# Patient Record
Sex: Male | Born: 1950 | Race: White | Hispanic: No | Marital: Single | Smoking: Current every day smoker
Health system: Southern US, Community
[De-identification: ages and names within clinical notes are randomized; demographics above are authoritative.]

## PROBLEM LIST (undated history)

## (undated) DIAGNOSIS — M069 Rheumatoid arthritis, unspecified: Secondary | ICD-10-CM

## (undated) DIAGNOSIS — I10 Essential (primary) hypertension: Secondary | ICD-10-CM

## (undated) DIAGNOSIS — E785 Hyperlipidemia, unspecified: Secondary | ICD-10-CM

## (undated) DIAGNOSIS — M109 Gout, unspecified: Secondary | ICD-10-CM

## (undated) DIAGNOSIS — D72829 Elevated white blood cell count, unspecified: Secondary | ICD-10-CM

## (undated) DIAGNOSIS — I2510 Atherosclerotic heart disease of native coronary artery without angina pectoris: Secondary | ICD-10-CM

## (undated) DIAGNOSIS — Z720 Tobacco use: Secondary | ICD-10-CM

## (undated) HISTORY — PX: KNEE SURGERY: SHX244

## (undated) HISTORY — PX: ANGIOPLASTY: SHX39

## (undated) HISTORY — DX: Elevated white blood cell count, unspecified: D72.829

## (undated) HISTORY — PX: APPENDECTOMY: SHX54

## (undated) HISTORY — PX: HEMORRHOID SURGERY: SHX153

## (undated) HISTORY — DX: Gout, unspecified: M10.9

---

## 2006-08-18 ENCOUNTER — Ambulatory Visit: Payer: Self-pay | Admitting: Cardiovascular Disease

## 2006-08-18 LAB — CONVERTED CEMR LAB
CO2: 31 meq/L (ref 19–32)
Chloride: 97 meq/L (ref 96–112)
GFR calc non Af Amer: 107 mL/min
Glucose, Bld: 98 mg/dL (ref 70–99)
HCT: 46.3 % (ref 39.0–52.0)
Hemoglobin: 15.7 g/dL (ref 13.0–17.0)
MCV: 90.5 fL (ref 78.0–100.0)
Platelets: 387 10*3/uL (ref 150–400)
Prothrombin Time: 11.8 s (ref 10.0–14.0)
RBC: 5.12 M/uL (ref 4.22–5.81)
Sodium: 138 meq/L (ref 135–145)

## 2006-08-21 ENCOUNTER — Ambulatory Visit: Payer: Self-pay | Admitting: Cardiovascular Disease

## 2006-08-21 ENCOUNTER — Inpatient Hospital Stay (HOSPITAL_BASED_OUTPATIENT_CLINIC_OR_DEPARTMENT_OTHER): Admission: RE | Admit: 2006-08-21 | Discharge: 2006-08-21 | Payer: Self-pay | Admitting: Cardiovascular Disease

## 2006-09-04 ENCOUNTER — Ambulatory Visit: Payer: Self-pay | Admitting: Cardiovascular Disease

## 2009-01-31 ENCOUNTER — Encounter: Admission: RE | Admit: 2009-01-31 | Discharge: 2009-01-31 | Payer: Self-pay | Admitting: Family Medicine

## 2010-12-07 NOTE — Cardiovascular Report (Signed)
[name], [name] NO.:  [id number]   MEDICAL RECORD NO.:  [id number]          PATIENT TYPE:  OIB   LOCATION:  1961                         FACILITY:  [facility]   PHYSICIAN:  Veverly Fells. Excell Seltzer, MD  DATE OF BIRTH:  01-Mar-1951   DATE OF PROCEDURE:  DATE OF DISCHARGE:  08/21/2006                            CARDIAC CATHETERIZATION   PROCEDURES:  1. Left heart catheterization.  2. Selective coronary angiography.  3. Left ventricular angiography.   INDICATION:  [name] is a very nice 60 year old gentleman who I  evaluated earlier this week in the office for complaints of substernal  chest pain.  He has had increasing symptoms over the last two months.  He had a previous cath that demonstrated nonobstructive coronary artery  disease greater than ten years ago.  The patient has dyslipidemia,  hypertension, tobacco abuse and a very strong family history coronary  artery disease.  With these multiple risk factors, I elected to proceed  with cardiac catheterization.   PROCEDURAL DETAILS:  The risks and indications of the procedure were  explained in detail to the patient.  Informed consent was obtained.  The  right groin was prepped, draped and anesthetized with 1% lidocaine. A 4-  French sheath was inserted into the right femoral artery using the  modified Seldinger technique.  Multiple views of the left and right  coronary arteries were taken with standard 4-French catheters.  A 4-  French pigtail catheter was inserted into the left ventricle and  ventriculography was performed.  A pullback across the aortic valve was  done.  All catheter exchanges were performed over a guidewire.  There  were no complications.   FINDINGS:  Aortic pressure 124/74 with a mean of 96, left  ventricular  pressure 121/14 with an end-diastolic pressure of __________ .   Coronary angiography:  The left mainstem is angiographically normal.  It  bifurcates into the LAD and left  circumflex.  The LAD is a large caliber  vessel that courses down to the left ventricular apex.  It gives off a  very small first and second diagonal branch and a medium-caliber third  diagonal branch.  The LAD is normal in its proximal segment.  In the mid-  segment just beyond the first septal perforator, there is an area of 30-  to-50% stenosis.  The remainder of the mid and distal LAD are free of  any significant angiographic disease.   The left circumflex system is dominant.  It gives off a large first  obtuse marginal branch.  Just beyond the marginal branch origin, there  is a 30% stenosis in the mid-circumflex.  It then courses down to give  off a  left PDA and left posterolateral branch.  The left PDA has an  area of 30-to-40% stenosis.   The right coronary artery is small and nondominant.  There is no  evidence of any angiographic disease.   Left ventricular function was assessed by 30 degree right anterior  oblique left ventriculogram __________  left ventricular function with  an overall left ventricular ejection fraction of 55%.  There is  no  mitral regurgitation.   ASSESSMENT:  1. Nonobstructive coronary artery disease.  2. Normal left ventricular function.   RECOMMENDATIONS:  I recommend continued medical  therapy.  I suspect  that [name]'s pain is noncardiac.  I think his LAD lesion is at  worst, 50%.  However, if he has persistent symptoms, we could consider a  stress Myoview, but I do not suspect that it is causing his chest pain  at this point.  He should continue with aggressive medical therapy, as  he does have coronary artery disease albeit nonobstructive.      Veverly Fells. Excell Seltzer, MD  Electronically Signed     MDC/MEDQ  D:  08/21/2006  T:  08/21/2006  Job:  [id number]   cc:   Silas Sacramento, M.D.

## 2010-12-07 NOTE — Assessment & Plan Note (Signed)
[facility]                            CARDIOLOGY OFFICE NOTE   NAME:[name], [name]                     MRN:          [id number]  DATE:09/04/2006                            DOB:          July 24, 1950    Angelena Sole was seen in followup after a diagnostic catheterization  performed at [facility] on January 31 of this year.  He initially  presented with substernal chest tightness and generalized fatigue over  the prior 2 months.  He had multiple cardiac risk factors including  hypertension, dyslipidemia, and tobacco abuse, and we elected to perform  a diagnostic catheterization based on his multiple risk factors and high  pre-test probability of significant CAD.   Mr. Mavis catheterization demonstrated nonobstructive coronary  artery disease.  He had a mild stenosis in the mid LAD in the range of  30-50%.  He also had nonobstructive disease of the mid left circumflex  with a 30% stenosis and a small nondominant right coronary artery with  no angiographic disease.  His left ventricular function was normal as  assessed by left ventriculography with an EF of 55%.   Since his procedure, Mr. Woessner has continued to have intermittent  chest pain.  This has not been consistently related to activity.  His  pain is not as bad as it had been in the past.   CURRENT MEDICATIONS:  1. Hydrochlorothiazide 25 mg daily.  2. Extended release metoprolol 100 mg daily.  3. Crestor 10 mg daily.  4. Aspirin 81 mg daily.  5. Centrum multivitamin daily.   ALLERGIES:  NKDA.   EXAMINATION:  The patient is alert and oriented.  He is in no acute  distress.  Weight 203 pounds, blood pressure 129/80, heart rate is 80, respiratory  rate is 12.  HEENT:  Normal.  NECK:  Normal carotid upstrokes without bruits.  Jugular venous pressure  is normal.  LUNGS:  Clear to auscultation bilaterally.  HEART:  Regular rate and rhythm without murmurs or gallops.  ABDOMEN:  Soft  and nontender.  No organomegaly.  EXTREMITIES:  The right groin site is clear.  There is no hematoma,  ecchymosis, or bruit.  Peripheral pulses are 2+ and equal.   ASSESSMENT:  Mr. Shall is currently stable from a cardiac standpoint.  His problems are as follows.  1. Nonobstructive coronary artery disease.  2. Hypertension.  3. Dyslipidemia.  4. Tobacco abuse.   I had a long discussion with Mr. Gappa regarding the findings from his  catheterization.  I think he should do very well in the long term.  The  most important thing he can do from a long-term risk reduction  standpoint is to stop smoking.  Regarding his intermittent pain, it may  be reasonable to give him an empiric trial of a proton pump inhibitor.  He also tells me that he may require a pulmonary evaluation as he has  had some abnormal pulmonary testing.  I will leave the treatment plan to  Dr. Neva Seat as Mr. Neal preferred to review his further diagnostic and  treatment options with Dr. Neva Seat.  I will see Mr. Masci back on an as needed basis if any problems arise.     Veverly Fells. Excell Seltzer, MD  Electronically Signed    MDC/MedQ  DD: 09/04/2006  DT: 09/04/2006  Job #: [id number]   cc:   Silas Sacramento, M.D.

## 2010-12-07 NOTE — Letter (Signed)
August 18, 2006    Silas Sacramento, MD  [facility]  [address]  [place], [place] [address]   RE:  [name], [name]  MRN:  [id number]  /  DOB:  07-Aug-1950   Dear Dr. Neva Seat:   It was my pleasure to see Wendelin Bradt at the [facility]  [facility] on August 18, 2006 as an outpatient.  As you know, Mr. Teixeira  is a very nice 60 year old man presenting for evaluation of chest pain.  He describes a history of substernal chest tightness and an associated  feeling of tiredness that has been increasing over the last 2 months.  His symptoms are not consistently related to exertion, but they do seem  to be worse on days that he is performing physical activities.  He  denies any associated dyspnea, light-headedness, syncope, nausea,  vomiting, or diaphoresis.  He also denies orthopnea, PND, palpitations,  edema, or leg claudication symptoms.   Mr. Entwistle underwent cardiac catheterization back in 1996 at [facility].  He also describes a history of a stress test around that same  time.  We do not have records for that, but will send for them.  He has  not had regular cardiac followup since that time.   PAST MEDICAL HISTORY:  Pertinent for the following.  1. Chest pain with subsequent cardiac catheterization.  Uncertain      results.  As above.  Will send for details.  2. Hypertension.  3. Dyslipidemia.  4. Tobacco abuse.  5. Remote appendectomy.  6. Hemorrhoidectomy in 1977.   The patient denies any other surgeries, hospitalizations, or medical  problems.   SOCIAL HISTORY:  The patient works in [employer].  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  He has been a smoker for over 40 years.  He used to smoke 2 packs per  day and has recently cut down to less than 1 pack per day.  He has no  history of drug use or alcohol use.  He is not married and has no  children.   FAMILY HISTORY:  His mother died at age 19  suddenly.  His father died at  age 23 of a myocardial infarction.  He has 3 sisters who have high blood  pressure and high cholesterol.  He has 1 brother who died after coronary  bypass of complications from a stroke at age 10.  He has a 32 year old  brother who recently had an ICD placed, and he has a 74 year old brother  who has had 5 bypasses and has also had an ICD placed.   REVIEW OF SYSTEMS:  A complete 12-point review of systems was performed.  There were no pertinent positives to report other than as described.   PHYSICAL EXAMINATION:  The patient is alert and oriented.  He is in no  acute distress.  Weight is 201 pounds, blood pressure 140/88, heart rate 75, respiratory  rate is 12.  HEENT:  Normal.  NECK:  Normal carotid upstrokes without bruits.  Jugular venous pressure  is normal.  No lymphadenopathy.  No lymphadenopathy or thyroid nodules.  LUNGS:  Clear to auscultation bilaterally.  CARDIOVASCULAR:  The apex is discrete and nondisplaced.  The heart is  regular rate and rhythm without murmurs or gallops.  ABDOMEN:  Soft, obese, and nontender.  No organomegaly.  No abdominal  bruits.  BACK:  No flank tenderness or paraspinal tenderness.  EXTREMITIES:  No cyanosis, clubbing, or  edema.  Peripheral pulses are 2+  and equal throughout.  SKIN:  Warm and dry without rash.  NEUROLOGIC:  Strength is 5/5 and equal in the arms and legs.  Cranial  nerves 2-12 are intact.   [facility] showed a  normal sinus rhythm and was within normal limits.   ASSESSMENT:  Mr. Goren is a 60 year old man with a very strong family  history of coronary artery disease as well as cardiac risk factors of  hypertension, dyslipidemia, and tobacco abuse, who presents with  substernal chest pressure.  He has both typical and atypical symptoms,  but he clearly has worsened symptoms with exertion.  He has 2 months now  of significant symptoms.  I have  reviewed the diagnostic options, which  reviewed both stress testing, as well as a diagnostic cardiac  catheterization.  I think the pretest probability of significant  coronary artery disease is high and I have recommended undergoing a  diagnostic catheterization.  The patient is agreeable.  The risks and  indications of a diagnostic cath were reviewed in detail, and the  patient understood fully.  In the meantime, he was advised to continue  on his current medical therapy as well as with his extended release  metoprolol, as well as daily aspirin and Statin therapy.  He was given a  prescription for sublingual nitroglycerin and instructed on its use.  He  was advised to contact EMS if he has any increase in symptoms or  significant resting symptoms.   We have scheduled him for a diagnostic cardiac catheterization to be  done in the [facility] later this week.  We will make further plans pending  the results of this study.   Regarding his hypertension and dyslipidemia, we will send for results of  his recent lipid panel.   Dr. Neva Seat, thanks again for allowing me to evaluate Mr. Trachtenberg.  I  will be in contact with you after Mr. Bursch's cardiac catheterization.  Please feel free to call at any time with questions regarding his care.    Sincerely,      Veverly Fells. Excell Seltzer, MD  Electronically Signed    MDC/MedQ  DD: 08/18/2006  DT: 08/18/2006  Job #: [id number]

## 2011-07-12 ENCOUNTER — Emergency Department (HOSPITAL_COMMUNITY): Payer: BC Managed Care – PPO

## 2011-07-12 ENCOUNTER — Other Ambulatory Visit: Payer: Self-pay

## 2011-07-12 ENCOUNTER — Emergency Department (HOSPITAL_COMMUNITY)
Admission: EM | Admit: 2011-07-12 | Discharge: 2011-07-12 | Disposition: A | Discharge status: Home / Self Care | Payer: BC Managed Care – PPO | Attending: Emergency Medicine | Admitting: Emergency Medicine

## 2011-07-12 ENCOUNTER — Ambulatory Visit (INDEPENDENT_AMBULATORY_CARE_PROVIDER_SITE_OTHER): Payer: BC Managed Care – PPO

## 2011-07-12 DIAGNOSIS — R51 Headache: Secondary | ICD-10-CM | POA: Insufficient documentation

## 2011-07-12 DIAGNOSIS — R000 Tachycardia, unspecified: Secondary | ICD-10-CM | POA: Insufficient documentation

## 2011-07-12 DIAGNOSIS — R55 Syncope and collapse: Secondary | ICD-10-CM | POA: Insufficient documentation

## 2011-07-12 DIAGNOSIS — R079 Chest pain, unspecified: Secondary | ICD-10-CM | POA: Insufficient documentation

## 2011-07-12 DIAGNOSIS — J111 Influenza due to unidentified influenza virus with other respiratory manifestations: Secondary | ICD-10-CM | POA: Insufficient documentation

## 2011-07-12 DIAGNOSIS — J189 Pneumonia, unspecified organism: Secondary | ICD-10-CM

## 2011-07-12 DIAGNOSIS — R1110 Vomiting, unspecified: Secondary | ICD-10-CM

## 2011-07-12 DIAGNOSIS — M79609 Pain in unspecified limb: Secondary | ICD-10-CM

## 2011-07-12 HISTORY — DX: Essential (primary) hypertension: I10

## 2011-07-12 HISTORY — DX: Hyperlipidemia, unspecified: E78.5

## 2011-07-12 HISTORY — DX: Atherosclerotic heart disease of native coronary artery without angina pectoris: I25.10

## 2011-07-12 LAB — BASIC METABOLIC PANEL
BUN: 13 mg/dL (ref 6–23)
CO2: 26 mEq/L (ref 19–32)
Calcium: 8.9 mg/dL (ref 8.4–10.5)
Creatinine, Ser: 0.82 mg/dL (ref 0.50–1.35)
GFR calc non Af Amer: >90 mL/min (ref >90)
Glucose, Bld: 103 mg/dL — ABNORMAL HIGH (ref 70–99)
Sodium: 132 mEq/L — ABNORMAL LOW (ref 135–145)

## 2011-07-12 LAB — CBC
HCT: 44 % (ref 39.0–52.0)
Hemoglobin: 14.9 g/dL (ref 13.0–17.0)
MCH: 30.3 pg (ref 26.0–34.0)
MCHC: 33.9 g/dL (ref 30.0–36.0)
MCV: 89.6 fL (ref 78.0–100.0)
RBC: 4.91 MIL/uL (ref 4.22–5.81)

## 2011-07-12 MED ORDER — OSELTAMIVIR PHOSPHATE 75 MG PO CAPS: 75.0000 mg | ORAL_CAPSULE | Freq: Two times a day (BID) | ORAL | Status: AC

## 2011-07-12 MED ORDER — SODIUM CHLORIDE 0.9 % IV BOLUS (SEPSIS)
1000.0000 mL | Freq: Once | INTRAVENOUS | Status: AC
  Administered 2011-07-12: 1000 mL via INTRAVENOUS

## 2011-07-12 MED ORDER — NITROGLYCERIN 0.4 MG SL SUBL
0.4000 mg | SUBLINGUAL_TABLET | SUBLINGUAL | Status: AC | PRN
  Administered 2011-07-12 (×3): 0.4 mg via SUBLINGUAL
  Filled 2011-07-12: qty 25

## 2011-07-12 NOTE — ED Notes (Signed)
Pt came from Urgent Care in [place], feverish and with 24 hours of muscle aches and malaise

## 2011-07-12 NOTE — ED Notes (Signed)
Pt reports relief of chest pressure after three dose of nitro.

## 2011-07-12 NOTE — ED Notes (Signed)
Lab informed of troponin and lab will draw this troponin

## 2011-07-12 NOTE — ED Provider Notes (Signed)
Medical screening examination/treatment/procedure(s) were conducted as a shared visit with non-physician practitioner(s) and myself.  I personally evaluated the patient during the encounter  Flu like symptoms with chest tightness intermittently x 2 days.  Syncopal episode at [facility] with nausea and vomiting.  Hx PCI without stents.  Glynn Octave, MD 07/12/11 [phone]

## 2011-07-12 NOTE — ED Notes (Signed)
Nitro given. Pt now resting. Will Check back in 5 min

## 2011-07-12 NOTE — ED Notes (Signed)
EDP in room re-evaluating patient

## 2011-07-12 NOTE — Consult Note (Signed)
HPI: 60 year old male with past medical history of nonobstructive coronary disease by previous catheterization, hypertension, hyperlipidemia with chest pain. Patient did have a cardiac catheterization in January of 2008. This revealed a 30-50% mid LAD, a 30% mid left circumflex lesion and a small nondominant right coronary artery with no disease. His ejection fraction was 55%. The patient typically does not have dyspnea on exertion, orthopnea, PND, pedal edema, exertional chest pain or syncope. Yesterday he developed general malaise, diffuse body aches, fevers, chills, nausea and vomiting. He then developed substernal chest tightness. The pain was not pleuritic, positional or related to food. It did not radiate. No associated nausea, shortness of breath or diaphoresis. It began at 7:30 last evening and has been essentially continuous since then. He was seen at urgent care and while in the waiting room he developed mild diaphoresis, nausea followed by frank syncope. No associated palpitations or chest pain. Cardiology is now asked to evaluate.  Medications Prior to Admission  Medication Dose Route Frequency Provider Last Rate Last Dose  . nitroGLYCERIN (NITROSTAT) SL tablet 0.4 mg  0.4 mg Sublingual Q5 min PRN Shaaron Adler, PA   0.4 mg at 07/12/11 1500  . sodium chloride 0.9 % bolus 1,000 mL  1,000 mL Intravenous Once Shaaron Adler, PA   1,000 mL at 07/12/11 1448   No current outpatient prescriptions on file as of 07/12/2011.    No Known Allergies  Past Medical History  Diagnosis Date  . CAD (coronary artery disease)   . Hypertension   . Hyperlipidemia     Past Surgical History  Procedure Date  . Appendectomy   . Hemorrhoid surgery     History   Social History  . Marital Status: Single    Spouse Name: N/A    Number of Children: N/A  . Years of Education: N/A   Occupational History  . Not on file.   Social History Main Topics  . Smoking status: Current  Everyday Smoker  . Smokeless tobacco: Not on file  . Alcohol Use: No  . Drug Use: Not on file  . Sexually Active: Not on file   Other Topics Concern  . Not on file   Social History Narrative  . No narrative on file    Family History  Problem Relation Age of Onset  . Coronary artery disease      Father with MI in his 9s    ROS: malaise, fevers and chills but no productive cough, hemoptysis, dysphasia, odynophagia, melena, hematochezia, dysuria, hematuria, rash, seizure activity, orthopnea, PND, pedal edema, claudication. Remaining systems are negative.  Physical Exam:   Blood pressure 129/84, pulse 105, temperature 99.1 F (37.3 C), temperature source Oral, resp. rate 19, SpO2 97.00%.  General:  Well developed/well nourished in NAD Skin warm/dry Patient not depressed No peripheral clubbing Back-normal HEENT-normal/normal eyelids Neck supple/normal carotid upstroke bilaterally; no bruits; no JVD; no thyromegaly chest - CTA/ normal expansion CV - RRR/normal S1 and S2; no murmurs, rubs or gallops;  PMI nondisplaced Abdomen -NT/ND, no HSM, no mass, + bowel sounds, no bruit 2+ femoral pulses, no bruits Ext-no edema, chords, 2+ DP Neuro-grossly nonfocal  ECG NSR with no ST changes.  Results for orders placed during the hospital encounter of 07/12/11 (from the past 48 hour(s))  CBC     Status: Abnormal   Collection Time   07/12/11 12:00 PM      Component Value Range Comment   WBC 11.7 (*) 4.0 - 10.5 (K/uL)  RBC 4.91  4.22 - 5.81 (MIL/uL)    Hemoglobin 14.9  13.0 - 17.0 (g/dL)    HCT 95.6  21.3 - 08.6 (%)    MCV 89.6  78.0 - 100.0 (fL)    MCH 30.3  26.0 - 34.0 (pg)    MCHC 33.9  30.0 - 36.0 (g/dL)    RDW 57.8  46.9 - 62.9 (%)    Platelets 312  150 - 400 (K/uL)   BASIC METABOLIC PANEL     Status: Abnormal   Collection Time   07/12/11 12:00 PM      Component Value Range Comment   Sodium 132 (*) 135 - 145 (mEq/L)    Potassium 4.1  3.5 - 5.1 (mEq/L)    Chloride 98   96 - 112 (mEq/L)    CO2 26  19 - 32 (mEq/L)    Glucose, Bld 103 (*) 70 - 99 (mg/dL)    BUN 13  6 - 23 (mg/dL)    Creatinine, Ser 5.28  0.50 - 1.35 (mg/dL)    Calcium 8.9  8.4 - 10.5 (mg/dL)    GFR calc non Af Amer >90  >90 (mL/min)    GFR calc Af Amer >90  >90 (mL/min)   POCT I-STAT TROPONIN I     Status: Normal   Collection Time   07/12/11  1:16 PM      Component Value Range Comment   Troponin i, poc 0.01  0.00 - 0.08 (ng/mL)    Comment 3              Dg Chest 2 View  07/12/2011  *RADIOLOGY REPORT*  Clinical Data: Cough.  Chest tightness.  Smoking history.  CHEST - 2 VIEW  Comparison: None.  Findings: Artifact overlies chest.  Heart size is normal. Mediastinal shadows are normal.  Lung markings are slightly prominent.  I think there is mild bronchial thickening.  No infiltrate, collapse or effusion.  No significant bony finding.  IMPRESSION: Increased markings consistent with smoking history.  Some bronchial thickening suggests bronchitis.  No pneumonia or collapse.  Original Report Authenticated By: Thomasenia Sales, M.D.    Assessment/Plan Patient Active Hospital Problem List:  #1-chest pain-the patient's symptoms are atypical. He clearly has flulike symptoms. His chest pain has been essentially continuous for 20 hours. His electrocardiogram is normal. His initial enzymes are negative. Would repeat enzymes and if negative he has essentially ruled out. Note previous cardiac catheterization showed nonobstructive coronary disease. If enzymes negative he can be discharged from a cardiac standpoint with outpatient functional study. #2 syncope-the patient had a syncopal episode in the setting of diaphoresis and mild nausea. He is also having flulike symptoms with decreased by mouth intake. I think this is most likely a vagal reaction. #3 history of nonobstructive coronary disease-would treat with aspirin, Toprol 25 mg daily and a statin. #4 hypertension-would add Toprol 25 mg daily.  #5-flulike  symptoms-management per emergency room  Patient can followup with Dr. Excell Seltzer following discharge and his functional study. Olga Millers MD 07/12/2011, 4:08 PM

## 2011-07-12 NOTE — ED Notes (Addendum)
Pt reports feeling little better with first dose of nitroglycerin SL. Second dose of Nitro given

## 2011-07-12 NOTE — ED Provider Notes (Signed)
History     CSN: [id number]  Arrival date & time 07/12/11  1042   First MD Initiated Contact with Patient 07/12/11 1113      Chief Complaint  Patient presents with  . Influenza    (Consider location/radiation/quality/duration/timing/severity/associated sxs/prior treatment) The history is provided by the patient and a relative.   the patient is transferred to the emergency department via EMS. He is a 60 year old male with a history significant for coronary artery disease and PCI in both 1996 and 2008 by [facility] cardiology, Dr. Excell Seltzer. He no longer follows up with the cardiologist. The patient reports that yesterday he had an onset of chills, fever, cough, headache, nasal congestion, myalgias. He also reports chest tightness that feels like "an elephant sitting on my chest" with radiation to the left arm associated with nausea and intermittent diaphoresis. When the patient presented to [facility] urgent care this morning for evaluation, he had a witnessed syncopal episode was preceded by a wave of nausea and diaphoresis. The chest tightness is persistent and unchanged, nothing makes it better or worse. There has been no prior treatment.  No past medical history on file.  No past surgical history on file.  No family history on file.  History  Substance Use Topics  . Smoking status: Not on file  . Smokeless tobacco: Not on file  . Alcohol Use: Not on file      Review of Systems  Constitutional: Positive for fever, chills, diaphoresis and fatigue.  HENT: Positive for congestion and rhinorrhea. Negative for ear pain, sore throat, neck pain, neck stiffness and tinnitus.   Eyes: Negative for pain and visual disturbance.  Respiratory: Positive for cough and chest tightness. Negative for shortness of breath and wheezing.   Cardiovascular: Positive for chest pain. Negative for palpitations and leg swelling.  Gastrointestinal: Positive for nausea and vomiting. Negative for abdominal pain and  constipation.  Genitourinary: Negative for dysuria, hematuria and flank pain.  Musculoskeletal: Negative for back pain, joint swelling and gait problem.  Skin: Negative for rash and wound.  Neurological: Positive for syncope, weakness and headaches. Negative for dizziness, seizures and speech difficulty.  Hematological: Does not bruise/bleed easily.  Psychiatric/Behavioral: Negative for confusion.    Allergies  Review of patient's allergies indicates no known allergies.  Home Medications  No current outpatient prescriptions on file.  BP 129/84  Pulse 105  Temp(Src) 99.1 F (37.3 C) (Oral)  Resp 19  SpO2 97%  Physical Exam  Nursing note and vitals reviewed. Constitutional: He is oriented to person, place, and time. He appears well-developed and well-nourished.       Uncomfortable appearing  HENT:  Head: Normocephalic.  Right Ear: External ear normal.  Left Ear: External ear normal.  Mouth/Throat: Oropharynx is clear and moist. No oropharyngeal exudate.       Bilateral TMs normal. Clear Rhinorrhea.  Eyes: Conjunctivae and EOM are normal. Pupils are equal, round, and reactive to light. Right eye exhibits no discharge. Left eye exhibits no discharge.  Neck: Normal range of motion. Neck supple. No JVD present.  Cardiovascular: Regular rhythm, normal heart sounds and intact distal pulses.   No murmur heard.      Tachycardia  Pulmonary/Chest: Effort normal and breath sounds normal. No respiratory distress. He has no wheezes. He exhibits no tenderness.  Abdominal: Soft. Bowel sounds are normal. He exhibits no distension. There is no tenderness.  Musculoskeletal: Normal range of motion. He exhibits no edema and no tenderness.  Lymphadenopathy:    He has  no cervical adenopathy.  Neurological: He is alert and oriented to person, place, and time. No cranial nerve deficit. Coordination normal.  Skin: Skin is warm and dry. No rash noted. He is not diaphoretic.  Psychiatric: His behavior  is normal.    ED Course  Procedures (including critical care time)  Labs Reviewed  CBC - Abnormal; Notable for the following:    WBC 11.7 (*)    All other components within normal limits  BASIC METABOLIC PANEL - Abnormal; Notable for the following:    Sodium 132 (*)    Glucose, Bld 103 (*)    All other components within normal limits  I-STAT TROPONIN I   Dg Chest 2 View  07/12/2011  *RADIOLOGY REPORT*  Clinical Data: Cough.  Chest tightness.  Smoking history.  CHEST - 2 VIEW  Comparison: None.  Findings: Artifact overlies chest.  Heart size is normal. Mediastinal shadows are normal.  Lung markings are slightly prominent.  I think there is mild bronchial thickening.  No infiltrate, collapse or effusion.  No significant bony finding.  IMPRESSION: Increased markings consistent with smoking history.  Some bronchial thickening suggests bronchitis.  No pneumonia or collapse.  Original Report Authenticated By: Thomasenia Sales, M.D.    Date: 07/12/2011  Rate: 95  Rhythm: sinus  QRS Axis: normal  Intervals: normal  ST/T Wave abnormalities: normal  Conduction Disutrbances:none  Narrative Interpretation:   Old EKG Reviewed: none available    Dx 1: Chest pain Dx 2: Influenza   MDM  Patient with both influenza symptoms and chest pain symptoms concerning for ACS given his history, the EKG was normal and troponin was negative. [facility] was consulted.        Shaaron Adler, [place] 07/12/11 1543  Shaaron Adler, [place] 07/12/11 1543   Pt seen by cardiologist, who does not feel this is secondary to ACS and recommends outpatient cardiac evaluation as needed. Second troponin ordered, pt to be discharged home if negative.  Elwyn Reach [place], [place] 07/12/11 [phone]

## 2011-07-25 ENCOUNTER — Other Ambulatory Visit (HOSPITAL_COMMUNITY): Payer: Self-pay | Admitting: Cardiology

## 2011-07-25 DIAGNOSIS — R079 Chest pain, unspecified: Secondary | ICD-10-CM

## 2011-07-31 ENCOUNTER — Ambulatory Visit (HOSPITAL_COMMUNITY): Payer: BC Managed Care – PPO | Attending: Cardiology | Admitting: Radiology

## 2011-07-31 VITALS — BP 140/82 | Ht 69.0 in | Wt 188.0 lb

## 2011-07-31 DIAGNOSIS — R5383 Other fatigue: Secondary | ICD-10-CM | POA: Insufficient documentation

## 2011-07-31 DIAGNOSIS — R55 Syncope and collapse: Secondary | ICD-10-CM | POA: Insufficient documentation

## 2011-07-31 DIAGNOSIS — Z8249 Family history of ischemic heart disease and other diseases of the circulatory system: Secondary | ICD-10-CM | POA: Insufficient documentation

## 2011-07-31 DIAGNOSIS — E785 Hyperlipidemia, unspecified: Secondary | ICD-10-CM | POA: Insufficient documentation

## 2011-07-31 DIAGNOSIS — R5381 Other malaise: Secondary | ICD-10-CM | POA: Insufficient documentation

## 2011-07-31 DIAGNOSIS — R0789 Other chest pain: Secondary | ICD-10-CM | POA: Insufficient documentation

## 2011-07-31 DIAGNOSIS — R110 Nausea: Secondary | ICD-10-CM | POA: Insufficient documentation

## 2011-07-31 DIAGNOSIS — R61 Generalized hyperhidrosis: Secondary | ICD-10-CM | POA: Insufficient documentation

## 2011-07-31 DIAGNOSIS — Z87891 Personal history of nicotine dependence: Secondary | ICD-10-CM | POA: Insufficient documentation

## 2011-07-31 DIAGNOSIS — R079 Chest pain, unspecified: Secondary | ICD-10-CM

## 2011-07-31 DIAGNOSIS — I10 Essential (primary) hypertension: Secondary | ICD-10-CM | POA: Insufficient documentation

## 2011-07-31 MED ORDER — TECHNETIUM TC 99M TETROFOSMIN IV KIT
33.0000 | PACK | Freq: Once | INTRAVENOUS | Status: AC | PRN
  Administered 2011-07-31: 33 via INTRAVENOUS

## 2011-07-31 MED ORDER — REGADENOSON 0.4 MG/5ML IV SOLN
0.4000 mg | Freq: Once | INTRAVENOUS | Status: AC
  Administered 2011-07-31: 0.4 mg via INTRAVENOUS

## 2011-07-31 MED ORDER — TECHNETIUM TC 99M TETROFOSMIN IV KIT
11.0000 | PACK | Freq: Once | INTRAVENOUS | Status: AC | PRN
  Administered 2011-07-31: 11 via INTRAVENOUS

## 2011-07-31 NOTE — Progress Notes (Signed)
[facility] SITE 3 NUCLEAR MED [address] [place] [address] [phone]  Cardiology Nuclear Med Study  [name] is a 61 y.o. male [id number] 24-May-1951   Nuclear Med Background Indication for Stress Test:  Evaluation for Ischemia and 07/12/11 Post Hospital: CP, Syncope, (-) enzymes, and (+) flu symptoms History: '08 Heart Catheterization: N/O CAD EF: 55% and '01 Myocardial Perfusion Study: EF: 53% (-) ischemia Cardiac Risk Factors: Family History - CAD, History of Smoking, Hypertension and Lipids  Symptoms:  Chest Pain, Chest Tightness, Diaphoresis, Fatigue, Nausea and Syncope   Nuclear Pre-Procedure Caffeine/Decaff Intake:  None  NPO After: 8:00pm   Lungs:  clear IV 0.9% NS with Angio Cath:  20g  IV Site: R Antecubital x 1, tolerated well IV Started by:  Irean Hong, RN  Chest Size (in):  42 Cup Size: n/a  Height: 5\' 9"  (1.753 m)  Weight:  188 lb (85.276 kg)  BMI:  Body mass index is 27.76 kg/(m^2). Tech Comments:  Off Toprol x 2 months per patient. Upon interviewing this patient before walking on the treadmill. He told me about his chest tightness/pain and that when he walks that his legs get very tired. If he walks for any length of time the fatigue turns to pain and tightness. We started on the treadmill and the patient started having chest tightness and leg fatigue that ended up being extreme leg pain and tightness. He was then switched to a walking Lexiscan. He stated that his chest tightness was 10/10 and his leg and pelvic tightness was 9/10. In recovery, the patient received 1 sublingual Nitro. He went from 9/10 to 6/10. His pelvis and leg pain eased also but remained always fatigued. The pictures were checked and were found to be good. DOD G. Ladona Ridgel was consulted about this patient. He stated since he didn't have anymore chest pain he was to follow up with Wende Mott on the 13th, as scheduled.  The patient was advised to go to the ED with chest  tightness or pain that didn't go away.    Nuclear Med Study 1 or 2 day study: 1 day  Stress Test Type:  Treadmill/Lexiscan  Reading MD: Marca Ancona, MD  Order Authorizing Provider:  Tonny Bollman, MD  Resting Radionuclide: Technetium 45m Tetrofosmin  Resting Radionuclide Dose: 11 mCi   Stress Radionuclide:  Technetium 39m Tetrofosmin  Stress Radionuclide Dose: 33 mCi           Stress Protocol Rest HR: 85 Stress HR: 141  Rest BP: 140/82 Stress BP: 194/80  Exercise Time (min): 6:07 METS: 6.20   Predicted Max HR: 160 bpm % Max HR: 88.12 bpm Rate Pressure Product: 21308   Dose of Adenosine (mg):  n/a Dose of Lexiscan: 0.4 mg  Dose of Atropine (mg): n/a Dose of Dobutamine: n/a mcg/kg/min (at max HR)  Stress Test Technologist: Milana Na, EMT-P  Nuclear Technologist:  Doyne Keel, CNMT     Rest Procedure:  Myocardial perfusion imaging was performed at rest 45 minutes following the intravenous administration of Technetium 66m Tetrofosmin. Rest ECG: NSR  Stress Procedure:  The patient received IV Lexiscan 0.4 mg over 15-seconds with concurrent low level exercise and then Technetium 33m Tetrofosmin was injected at 30-seconds while the patient continued walking one more minute.  There were no significant changes with Lexiscan.  Quantitative spect images were obtained after a 45-minute delay.  This patient had chest tightness and extreme bilateral leg and cramping with exercise. He was  then given Lexiscan in order to finish his test. Stress ECG: No significant change from baseline ECG  QPS Raw Data Images:  Normal; no motion artifact; normal heart/lung ratio. Stress Images:  Normal homogeneous uptake in all areas of the myocardium. Rest Images:  Normal homogeneous uptake in all areas of the myocardium. Subtraction (SDS):  There is no evidence of scar or ischemia. Transient Ischemic Dilatation (Normal <1.22):  .97 Lung/Heart Ratio (Normal <0.45):  .37  Quantitative Gated Spect  Images QGS EDV:  129 ml QGS ESV:  59 ml QGS cine images:  Low normal systolic function, normal wall motion.  QGS EF: 54%  Impression Exercise Capacity:  Fair exercise capacity. BP Response:  BP fell with exercise (abnormal response).  Clinical Symptoms:  Significant chest pain.  ECG Impression:  No significant ST segment change suggestive of ischemia. Comparison with Prior Nuclear Study: No significant change from previous study  Overall Impression:  No evidence of ischemia or infarction by perfusion images or ECG.  Patient had significant chest pain during the study.   Aften Lipsey [employer]

## 2011-08-06 ENCOUNTER — Encounter: Payer: Self-pay | Admitting: *Deleted

## 2011-08-07 ENCOUNTER — Ambulatory Visit (INDEPENDENT_AMBULATORY_CARE_PROVIDER_SITE_OTHER): Payer: BC Managed Care – PPO | Admitting: Physician Assistant

## 2011-08-07 ENCOUNTER — Encounter: Payer: Self-pay | Admitting: Physician Assistant

## 2011-08-07 ENCOUNTER — Telehealth: Payer: Self-pay

## 2011-08-07 ENCOUNTER — Encounter: Payer: Self-pay | Admitting: *Deleted

## 2011-08-07 DIAGNOSIS — I2510 Atherosclerotic heart disease of native coronary artery without angina pectoris: Secondary | ICD-10-CM | POA: Insufficient documentation

## 2011-08-07 DIAGNOSIS — E785 Hyperlipidemia, unspecified: Secondary | ICD-10-CM | POA: Insufficient documentation

## 2011-08-07 DIAGNOSIS — F17200 Nicotine dependence, unspecified, uncomplicated: Secondary | ICD-10-CM

## 2011-08-07 DIAGNOSIS — I739 Peripheral vascular disease, unspecified: Secondary | ICD-10-CM | POA: Insufficient documentation

## 2011-08-07 DIAGNOSIS — R079 Chest pain, unspecified: Secondary | ICD-10-CM

## 2011-08-07 DIAGNOSIS — I10 Essential (primary) hypertension: Secondary | ICD-10-CM | POA: Insufficient documentation

## 2011-08-07 DIAGNOSIS — Z720 Tobacco use: Secondary | ICD-10-CM

## 2011-08-07 LAB — CBC WITH DIFFERENTIAL/PLATELET
Basophils Absolute: 0.1 10*3/uL (ref 0.0–0.1)
HCT: 43.9 % (ref 39.0–52.0)
Hemoglobin: 15.2 g/dL (ref 13.0–17.0)
Lymphs Abs: 2.3 10*3/uL (ref 0.7–4.0)
MCV: 91.7 fl (ref 78.0–100.0)
Monocytes Absolute: 0.5 10*3/uL (ref 0.1–1.0)
Monocytes Relative: 5.1 % (ref 3.0–12.0)
Neutro Abs: 7.3 10*3/uL (ref 1.4–7.7)
Platelets: 335 10*3/uL (ref 150.0–400.0)
RDW: 14 % (ref 11.5–14.6)

## 2011-08-07 LAB — BASIC METABOLIC PANEL
BUN: 14 mg/dL (ref 6–23)
CO2: 28 mEq/L (ref 19–32)
Chloride: 103 mEq/L (ref 96–112)
Glucose, Bld: 74 mg/dL (ref 70–99)
Potassium: 4.1 mEq/L (ref 3.5–5.1)
Sodium: 142 mEq/L (ref 135–145)

## 2011-08-07 LAB — PROTIME-INR: Prothrombin Time: 10.6 s (ref 10.2–12.4)

## 2011-08-07 MED ORDER — NITROGLYCERIN 0.4 MG SL SUBL: 0.4000 mg | SUBLINGUAL_TABLET | SUBLINGUAL | Status: DC | PRN

## 2011-08-07 MED ORDER — METOPROLOL SUCCINATE ER 50 MG PO TB24: 50.0000 mg | ORAL_TABLET | Freq: Every day | ORAL | Status: DC

## 2011-08-07 MED ORDER — ASPIRIN 81 MG PO CHEW: 81.0000 mg | CHEWABLE_TABLET | Freq: Every day | ORAL | Status: AC

## 2011-08-07 MED ORDER — ROSUVASTATIN CALCIUM 10 MG PO TABS: 10.0000 mg | ORAL_TABLET | Freq: Every day | ORAL | Status: DC

## 2011-08-07 NOTE — Progress Notes (Signed)
[address] [place], [place]  [address] Phone: [phone] Fax:  [phone]  Date:  08/07/2011   Name:  [name]       DOB:  08/10/1950 MRN:  [id number]  PCP:  None  Primary Cardiologist:  Dr. Michael Cooper  Primary Electrophysiologist:  None    History of Present Illness: [name] is a 60 y.o. male who presents for post hospital follow up.  He has a history of hypertension, hyperlipidemia and tobacco abuse.  He underwent diagnostic heart catheterization January 2008: Mid LAD 30-50%, mid circumflex 30%, left PDA 30%, EF 55%.  He was recently evaluated in the emergency room by Dr. Crenshaw 07/12/11.  He had recent symptoms of general malaise, diffuse body aches, fevers, chills, nausea and vomiting.  He also had substernal chest tightness.  It was fairly continuous for several hours.  He was in an urgent care and developed diaphoresis nausea and then syncope.  Cardiac markers remained negative.  Chest x-ray with bronchial thickening suggesting bronchitis.  Hemoglobin 14.9, potassium 4.1, creatinine 0.82.  It was felt his symptoms were atypical and his chest pain had been continuous for about 20 hours.  With negative markers it was felt that he could be sent home from the emergency room and follow up with an outpatient stress test.  It was felt that his syncopal episode was a vagal reaction in the setting of flu-like symptoms and no further workup was recommended.  It was recommended that he remain on Toprol, statin and aspirin given his history of coronary disease and hypertension.  His flulike symptoms have resolved.  He continues to have exertional chest tightness.  This is associated with shortness of breath.  He denies any further syncope.  He denies orthopnea, PND or edema.  He does note discomfort in his bilateral upper thighs.  He says his legs tire out of he walks any distance.  His legs were painful during his stress test.  Stress Myoview was  performed 07/30/10.  Initially, he was walked on the treadmill and his blood pressure dropped from 140/90-119/77.  He continued to have chest pain and his test was changed to Lexiscan.  He required nitroglycerin.  His images were negative for ischemia or scar and his EF was 54%.  Past Medical History  Diagnosis Date  . CAD (coronary artery disease)     LHC 1/08: mLAD 30-50%, mCFX 30%, left PDA 30%,  EF 55%;    Myoview  07/31/11: EF 54%, no scar or ischemia, + chest pain, + hypotensive response  . Hypertension   . Hyperlipidemia   . Chest pain     No current outpatient prescriptions on file.    Allergies: No Known Allergies  History  Substance Use Topics  . Smoking status: Current Everyday Smoker  . Smokeless tobacco: Not on file  . Alcohol Use: No     ROS:  Please see the history of present illness.   He has had some hemorrhoidal bleeding recently.  All other systems reviewed and negative.   PHYSICAL EXAM: VS:  BP 162/87  Pulse 88  Resp 18  Ht 5' 9" (1.753 m)  Wt 194 lb 1.9 oz (88.052 kg)  BMI 28.67 kg/m2 Well nourished, well developed, in no acute distress HEENT: normal Neck: no JVD Vascular: No carotid bruits; No femoral artery bruits; femoral artery pulses are 2+ bilaterally, DP/PT somewhat diminished bilaterally Endocrine: No thyromegaly Cardiac:  normal S1, S2; RRR; no murmur Lungs:    clear to auscultation bilaterally, no wheezing, rhonchi or rales Abd: soft, nontender, no hepatomegaly Ext: no edema Skin: warm and dry Neuro:  CNs 2-12 intact, no focal abnormalities noted Psych: Normal affect  EKG:   Sinus rhythm, heart rate 89, normal axis, no ischemic changes  ASSESSMENT AND PLAN:  

## 2011-08-07 NOTE — Telephone Encounter (Signed)
Called [facility] [employer] [employer] [facility]. To verify refill from [facility] was received.LVM of all orders for Toprol XL 50 mg, Nitrostat 0.4 mg, and Crestor 10 mg.

## 2011-08-07 NOTE — Assessment & Plan Note (Signed)
Discussed with Dr. Excell Seltzer.  He will perform a distal aortoilio-gram during his cardiac catheterization.

## 2011-08-07 NOTE — Patient Instructions (Addendum)
Your physician recommends that you schedule a follow-up appointment after your procedure Your physician recommends that you return for lab work in: today (BMP, CBC, INR) Your physician has recommended you make the following change in your medication: START Crestor 10 mg daily and Metoprolol 50 mg daily and Aspirin 81 mg daily and KEEP Nitro on hand and use as directed

## 2011-08-07 NOTE — Assessment & Plan Note (Signed)
He continues to have exertional chest pain that is somewhat suggestive of angina.  He has a history of nausea but coronary disease.  He has untreated hypertension and untreated hyperlipidemia.  He continues to smoke.  His nuclear images were normal.  However, his blood pressure drop with exercise and he was switched to a LexiScan secondary to ongoing chest pain.  I recommended cardiac catheterization.  I discussed this with Dr. Excell Seltzer who agreed.  Risks and benefits of cardiac catheterization have been discussed with the patient.  These include bleeding, infection, kidney damage, stroke, heart attack, death.  The patient understands these risks and is willing to proceed.

## 2011-08-07 NOTE — Assessment & Plan Note (Signed)
Start aspirin 81 mg daily.  Give a prescription for nitroglycerin p.r.n.  Proceed with cardiac catheterization as noted.

## 2011-08-07 NOTE — Assessment & Plan Note (Signed)
Cessation is recommended. 

## 2011-08-07 NOTE — Assessment & Plan Note (Signed)
Start Toprol-XL 50 mg daily.

## 2011-08-07 NOTE — Assessment & Plan Note (Signed)
He was intolerant to Lipitor in the past.  He did tolerate Crestor.  Start Crestor 10 mg daily.

## 2011-08-12 ENCOUNTER — Inpatient Hospital Stay (HOSPITAL_BASED_OUTPATIENT_CLINIC_OR_DEPARTMENT_OTHER)
Admission: RE | Admit: 2011-08-12 | Discharge: 2011-08-12 | Disposition: A | Discharge status: Home / Self Care | Payer: BC Managed Care – PPO | Source: Ambulatory Visit | Attending: Cardiovascular Disease | Admitting: Cardiovascular Disease

## 2011-08-12 ENCOUNTER — Encounter (HOSPITAL_BASED_OUTPATIENT_CLINIC_OR_DEPARTMENT_OTHER)
Admission: RE | Disposition: A | Discharge status: Home / Self Care | Payer: Self-pay | Source: Ambulatory Visit | Attending: Cardiovascular Disease

## 2011-08-12 DIAGNOSIS — I209 Angina pectoris, unspecified: Secondary | ICD-10-CM | POA: Insufficient documentation

## 2011-08-12 DIAGNOSIS — I2510 Atherosclerotic heart disease of native coronary artery without angina pectoris: Secondary | ICD-10-CM | POA: Insufficient documentation

## 2011-08-12 DIAGNOSIS — I10 Essential (primary) hypertension: Secondary | ICD-10-CM | POA: Insufficient documentation

## 2011-08-12 DIAGNOSIS — E785 Hyperlipidemia, unspecified: Secondary | ICD-10-CM | POA: Insufficient documentation

## 2011-08-12 DIAGNOSIS — F17200 Nicotine dependence, unspecified, uncomplicated: Secondary | ICD-10-CM | POA: Insufficient documentation

## 2011-08-12 SURGERY — JV LEFT AND RIGHT HEART CATHETERIZATION WITH CORONARY ANGIOGRAM
Anesthesia: Moderate Sedation

## 2011-08-12 MED ORDER — SODIUM CHLORIDE 0.9 % IJ SOLN: 3.0000 mL | Freq: Two times a day (BID) | INTRAMUSCULAR | Status: DC

## 2011-08-12 MED ORDER — SODIUM CHLORIDE 0.9 % IV SOLN: INTRAVENOUS | Status: DC

## 2011-08-12 MED ORDER — SODIUM CHLORIDE 0.9 % IV SOLN: 1.0000 mL/kg/h | INTRAVENOUS | Status: DC

## 2011-08-12 MED ORDER — OXYCODONE-ACETAMINOPHEN 5-325 MG PO TABS: 1.0000 | ORAL_TABLET | ORAL | Status: DC | PRN

## 2011-08-12 MED ORDER — ACETAMINOPHEN 325 MG PO TABS: 650.0000 mg | ORAL_TABLET | ORAL | Status: DC | PRN

## 2011-08-12 MED ORDER — SODIUM CHLORIDE 0.9 % IJ SOLN: 3.0000 mL | INTRAMUSCULAR | Status: DC | PRN

## 2011-08-12 MED ORDER — SODIUM CHLORIDE 0.9 % IV SOLN: 250.0000 mL | INTRAVENOUS | Status: DC | PRN

## 2011-08-12 MED ORDER — ASPIRIN 81 MG PO CHEW
324.0000 mg | CHEWABLE_TABLET | ORAL | Status: AC
  Administered 2011-08-12: 324 mg via ORAL

## 2011-08-12 MED ORDER — ONDANSETRON HCL 4 MG/2ML IJ SOLN: 4.0000 mg | Freq: Four times a day (QID) | INTRAMUSCULAR | Status: DC | PRN

## 2011-08-12 NOTE — OR Nursing (Signed)
Meal served 

## 2011-08-12 NOTE — Op Note (Signed)
Cardiac Catheterization Procedure Note  Name: [name] MRN: [id number] DOB: [birth year]  Procedure: Left Heart Cath, Selective Coronary Angiography, LV angiography  Indication: 61 year old gentleman with exertional angina. He said a normal Myoview stress scan but there was a blood pressure drop with exertion. With his typical symptoms he was referred for cardiac cath.   Procedural details: The right groin was prepped, draped, and anesthetized with 1% lidocaine. Using modified Seldinger technique, a 4 French sheath was introduced into the right femoral artery. Standard Judkins catheters were used for coronary angiography and left ventriculography. Catheter exchanges were performed over a guidewire. There were no immediate procedural complications. The patient was transferred to the post catheterization recovery area for further monitoring.  Procedural Findings: Hemodynamics:  AO 119/62 with a mean of 87 LV 121/15   Coronary angiography: Coronary dominance: right  Left mainstem: The left main stem is widely patent with no significant obstructive disease.  Left anterior descending (LAD): The LAD has diffuse moderate narrowing just after the first septal perforator extending past the second diagonal branch. There is a long segment of 60% stenosis. The first diagonal is tiny the second diagonal is moderate in caliber and it has 50% ostial stenosis. There is mild diffuse mid segment LAD disease beyond the second diagonal in the apical portion of the LAD is free of significant disease.  Left circumflex (LCx): The left circumflex is a large, dominant vessel. The vessel supplies a large first obtuse marginal branch and a moderate caliber second OM branch with a moderate caliber PDA branch. There is no significant proximal vessel disease. There is mild stenosis involving the distal AV groove circumflex as it extends into the PDA branch. There are no high-grade stenoses throughout the left  circumflex  Right coronary artery (RCA): The right coronary artery is a small, nondominant vessel with no obstructive disease.  Left ventriculography: Left ventricular systolic function is normal, LVEF is estimated at 55%, there is no significant mitral regurgitation   Final Conclusions:   1. Moderate diffuse mid LAD stenosis 2. Mild nonobstructive left circumflex stenosis (dominant vessel) 3. Small, nondominant right coronary artery no significant disease 4. Normal LV function  Recommendations: The patient's LAD is diffusely diseased. In this patient who just had a negative Myoview stress scan, I would recommend medical therapy. Because of the long lesion length and diffuse nature of the patient's mid and distal LAD stenosis, I do not think this is an optimal vessel for PCI. He could be treated if he fails medical therapy. Another consideration would be single vessel bypass grafting, but again I do not feel that his lesion severity warrants this at the present time.  Tonny Bollman 08/12/2011, 11:21 AM

## 2011-08-12 NOTE — OR Nursing (Signed)
Discharge instructions reviewed and signed, pt stated understanding, ambulated in hall without difficulty, site level 0, transported to friend's car via wheelchair 

## 2011-08-12 NOTE — Interval H&P Note (Signed)
History and Physical Interval Note:  08/12/2011 10:46 AM  [name]  has presented today for surgery, with the diagnosis of cp  The various methods of treatment have been discussed with the patient and family. After consideration of risks, benefits and other options for treatment, the patient has consented to  Procedure(s): JV LEFT AND RIGHT HEART CATHETERIZATION WITH CORONARY ANGIOGRAM as a surgical intervention .  The patients' history has been reviewed, patient examined, no change in status, stable for surgery.  I have reviewed the patients' chart and labs.  Questions were answered to the patient's satisfaction.     Tonny Bollman

## 2011-08-12 NOTE — OR Nursing (Signed)
Dr Cooper at bedside to discuss results and treatment plan with pt and family 

## 2011-08-12 NOTE — OR Nursing (Signed)
Tegaderm dressing applied, site intact, level 0, bedrest begins at 1130

## 2011-08-12 NOTE — H&P (View-Only) (Signed)
[address] [place], [place]  [address] Phone: [phone] Fax:  [phone]  Date:  08/07/2011   Name:  [name]       DOB:  04-18-1951 MRN:  [id number]  PCP:  None  Primary Cardiologist:  Dr. Tonny Bollman  Primary Electrophysiologist:  None    History of Present Illness: [name] is a 61 y.o. male who presents for post hospital follow up.  He has a history of hypertension, hyperlipidemia and tobacco abuse.  He underwent diagnostic heart catheterization January 2008: Mid LAD 30-50%, mid circumflex 30%, left PDA 30%, EF 55%.  He was recently evaluated in the emergency room by Dr. Jens Som 07/12/11.  He had recent symptoms of general malaise, diffuse body aches, fevers, chills, nausea and vomiting.  He also had substernal chest tightness.  It was fairly continuous for several hours.  He was in an urgent care and developed diaphoresis nausea and then syncope.  Cardiac markers remained negative.  Chest x-ray with bronchial thickening suggesting bronchitis.  Hemoglobin 14.9, potassium 4.1, creatinine 0.82.  It was felt his symptoms were atypical and his chest pain had been continuous for about 20 hours.  With negative markers it was felt that he could be sent home from the emergency room and follow up with an outpatient stress test.  It was felt that his syncopal episode was a vagal reaction in the setting of flu-like symptoms and no further workup was recommended.  It was recommended that he remain on Toprol, statin and aspirin given his history of coronary disease and hypertension.  His flulike symptoms have resolved.  He continues to have exertional chest tightness.  This is associated with shortness of breath.  He denies any further syncope.  He denies orthopnea, PND or edema.  He does note discomfort in his bilateral upper thighs.  He says his legs tire out of he walks any distance.  His legs were painful during his stress test.  Stress Myoview was  performed 07/30/10.  Initially, he was walked on the treadmill and his blood pressure dropped from 140/90-119/77.  He continued to have chest pain and his test was changed to [facility].  He required nitroglycerin.  His images were negative for ischemia or scar and his EF was 54%.  Past Medical History  Diagnosis Date  . CAD (coronary artery disease)     LHC 1/08: mLAD 30-50%, mCFX 30%, left PDA 30%,  EF 55%;    Myoview  07/31/11: EF 54%, no scar or ischemia, + chest pain, + hypotensive response  . Hypertension   . Hyperlipidemia   . Chest pain     No current outpatient prescriptions on file.    Allergies: No Known Allergies  History  Substance Use Topics  . Smoking status: Current Everyday Smoker  . Smokeless tobacco: Not on file  . Alcohol Use: No     ROS:  Please see the history of present illness.   He has had some hemorrhoidal bleeding recently.  All other systems reviewed and negative.   PHYSICAL EXAM: VS:  BP 162/87  Pulse 88  Resp 18  Ht 5\' 9"  (1.753 m)  Wt 194 lb 1.9 oz (88.052 kg)  BMI 28.67 kg/m2 Well nourished, well developed, in no acute distress HEENT: normal Neck: no JVD Vascular: No carotid bruits; No femoral artery bruits; femoral artery pulses are 2+ bilaterally, DP/PT somewhat diminished bilaterally Endocrine: No thyromegaly Cardiac:  normal S1, S2; RRR; no murmur Lungs:  clear to auscultation bilaterally, no wheezing, rhonchi or rales Abd: soft, nontender, no hepatomegaly Ext: no edema Skin: warm and dry Neuro:  CNs 2-12 intact, no focal abnormalities noted Psych: Normal affect  EKG:   Sinus rhythm, heart rate 89, normal axis, no ischemic changes  ASSESSMENT AND PLAN:

## 2011-08-13 ENCOUNTER — Telehealth: Payer: Self-pay | Admitting: Physician Assistant

## 2011-08-13 NOTE — Telephone Encounter (Signed)
New msg [facility] needs authorization for crestor please call [phone]

## 2011-08-13 NOTE — Telephone Encounter (Signed)
Patient Rx already sent in to this pharmacy.  Msg said to call [facility], but the number provided is for [facility].  [facility] verified [facility] and Rx had already been sent in on the 16th.  Judithe Modest, CMA

## 2011-08-15 ENCOUNTER — Other Ambulatory Visit: Payer: Self-pay | Admitting: Cardiovascular Disease

## 2011-08-15 MED ORDER — ROSUVASTATIN CALCIUM 10 MG PO TABS: 10.0000 mg | ORAL_TABLET | Freq: Every day | ORAL | Status: DC

## 2011-08-15 NOTE — Telephone Encounter (Signed)
I called [employer] [phone] and spoke with Shawna Orleans B about obtaining a prior authorization for Crestor.  She will fax a form to complete. Reference #[id number]

## 2011-08-15 NOTE — Telephone Encounter (Signed)
New Msg: pt calling stating that he is out of the medication. Pt insurance wants to know why MD wants pt to take crestor. Please call [employer] to discuss further.

## 2011-08-15 NOTE — Telephone Encounter (Signed)
I spoke with the pt and made him aware that I have contacted [employer] and completed form for prior authorization review.  Samples of Crestor placed at the front desk for pt pick up.

## 2011-08-27 ENCOUNTER — Encounter: Payer: Self-pay | Admitting: Cardiovascular Disease

## 2011-08-27 ENCOUNTER — Ambulatory Visit (INDEPENDENT_AMBULATORY_CARE_PROVIDER_SITE_OTHER): Payer: BC Managed Care – PPO | Admitting: Cardiovascular Disease

## 2011-08-27 DIAGNOSIS — Z720 Tobacco use: Secondary | ICD-10-CM

## 2011-08-27 DIAGNOSIS — E785 Hyperlipidemia, unspecified: Secondary | ICD-10-CM

## 2011-08-27 DIAGNOSIS — E7800 Pure hypercholesterolemia, unspecified: Secondary | ICD-10-CM

## 2011-08-27 DIAGNOSIS — I10 Essential (primary) hypertension: Secondary | ICD-10-CM

## 2011-08-27 DIAGNOSIS — I2510 Atherosclerotic heart disease of native coronary artery without angina pectoris: Secondary | ICD-10-CM

## 2011-08-27 DIAGNOSIS — F17200 Nicotine dependence, unspecified, uncomplicated: Secondary | ICD-10-CM

## 2011-08-27 MED ORDER — LOSARTAN POTASSIUM 50 MG PO TABS: 50.0000 mg | ORAL_TABLET | Freq: Every day | ORAL | Status: DC

## 2011-08-27 NOTE — Progress Notes (Signed)
HPI:  61 year old gentleman presenting for followup of coronary artery disease. The patient presented with chest pain in January and underwent Myoview stress testing which showed no ischemia. However, he had chest pain with exertion and a blood pressure drop was noted. He was referred for cardiac catheterization which demonstrated moderate, long segment stenosis of the mid LAD. There was no other significant coronary disease noted. In light of his negative stress test, I elected to treat him medically. He presents today for followup.  The patient is doing fairly well. 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 This was brief and resolved quickly with rest. He denies dyspnea, edema, palpitations, or lightheadedness. He's had no symptoms with light activities. He has cut down to less than a half a pack of cigarettes per day. He reports compliance with his medications.  Outpatient Encounter Prescriptions as of 08/27/2011  Medication Sig Dispense Refill  . aspirin (ASPIRIN CHILDRENS) 81 MG chewable tablet Chew 1 tablet (81 mg total) by mouth daily.  36 tablet  11  . metoprolol succinate (TOPROL XL) 50 MG 24 hr tablet Take 1 tablet (50 mg total) by mouth daily. Take with or immediately following a meal.  30 tablet  11  . nitroGLYCERIN (NITROSTAT) 0.4 MG SL tablet Place 1 tablet (0.4 mg total) under the tongue every 5 (five) minutes as needed for chest pain.  25 tablet  3  . rosuvastatin (CRESTOR) 10 MG tablet Take 1 tablet (10 mg total) by mouth at bedtime.  30 tablet  11    No Known Allergies  Past Medical History  Diagnosis Date  . CAD (coronary artery disease)     LHC 1/08: mLAD 30-50%, mCFX 30%, left PDA 30%,  EF 55%;    Myoview  07/31/11: EF 54%, no scar or ischemia, + chest pain, + hypotensive response  . Hypertension   . Hyperlipidemia   . Chest pain     ROS: Negative except as per HPI  BP 161/86  Pulse 87   Ht 5\' 9"  (1.753 m)  Wt 87.907 kg (193 lb 12.8 oz)  BMI 28.62 kg/m2  PHYSICAL EXAM: Pt is alert and oriented, NAD HEENT: normal Neck: JVP - normal, carotids 2+= without bruits Lungs: CTA bilaterally CV: RRR without murmur or gallop Abd: soft, NT, Positive BS, no hepatomegaly Ext: no C/C/E, distal pulses intact and equal Skin: warm/dry no rash  ASSESSMENT AND PLAN:

## 2011-08-27 NOTE — Assessment & Plan Note (Signed)
Cessation counseling was done.

## 2011-08-27 NOTE — Patient Instructions (Signed)
Your physician wants you to follow-up in: 6 MONTHS. You will receive a reminder letter in the mail two months in advance. If you don't receive a letter, please call our office to schedule the follow-up appointment.  Your physician recommends that you return for a FASTING LIPID, LIVER and BMP in 2 MONTHS--nothing to eat or drink after midnight, lab opens at 8:30.  Your physician has recommended you make the following change in your medication: START Losartan 50mg  take one by mouth daily

## 2011-08-27 NOTE — Assessment & Plan Note (Signed)
The patient's cardiac catheter findings were reviewed with him in detail. He has long segment moderate stenosis of the LAD. I think we have some room to improve with medical management, but if he has progressive symptoms we can certainly plan on PCI. I recommended starting losartan 50 mg daily and continuing on long-acting metoprolol. Tobacco cessation was discussed and he understands the importance of this as it relates to his cardiac disease. I will see him back in 6 months for followup, but I stressed the importance of contacting us sooner if his symptoms worsen.

## 2011-08-27 NOTE — Assessment & Plan Note (Signed)
Blood pressure control is suboptimal. Losartan 50 mg added to his medical regime.

## 2011-08-27 NOTE — Assessment & Plan Note (Signed)
The patient was recently started on Crestor. His lipids will be checked in about 2 months. We'll also check a metabolic panel when he returns as he is started on losartan today.

## 2011-10-21 ENCOUNTER — Other Ambulatory Visit: Payer: BC Managed Care – PPO

## 2012-03-17 ENCOUNTER — Other Ambulatory Visit (INDEPENDENT_AMBULATORY_CARE_PROVIDER_SITE_OTHER): Payer: BC Managed Care – PPO

## 2012-03-17 ENCOUNTER — Encounter: Payer: Self-pay | Admitting: Cardiovascular Disease

## 2012-03-17 ENCOUNTER — Ambulatory Visit (INDEPENDENT_AMBULATORY_CARE_PROVIDER_SITE_OTHER): Payer: BC Managed Care – PPO | Admitting: Cardiovascular Disease

## 2012-03-17 ENCOUNTER — Ambulatory Visit (INDEPENDENT_AMBULATORY_CARE_PROVIDER_SITE_OTHER)
Admission: RE | Admit: 2012-03-17 | Discharge: 2012-03-17 | Disposition: A | Discharge status: Home / Self Care | Payer: BC Managed Care – PPO | Source: Ambulatory Visit | Attending: Cardiovascular Disease | Admitting: Cardiovascular Disease

## 2012-03-17 VITALS — BP 159/82 | HR 100 | Ht 68.5 in | Wt 194.8 lb

## 2012-03-17 DIAGNOSIS — F17200 Nicotine dependence, unspecified, uncomplicated: Secondary | ICD-10-CM

## 2012-03-17 DIAGNOSIS — I2510 Atherosclerotic heart disease of native coronary artery without angina pectoris: Secondary | ICD-10-CM

## 2012-03-17 DIAGNOSIS — Z720 Tobacco use: Secondary | ICD-10-CM

## 2012-03-17 DIAGNOSIS — I10 Essential (primary) hypertension: Secondary | ICD-10-CM

## 2012-03-17 DIAGNOSIS — R079 Chest pain, unspecified: Secondary | ICD-10-CM

## 2012-03-17 LAB — CBC WITH DIFFERENTIAL/PLATELET
Basophils Absolute: 0.1 10*3/uL (ref 0.0–0.1)
Basophils Relative: 1 % (ref 0.0–3.0)
Eosinophils Absolute: 0.2 10*3/uL (ref 0.0–0.7)
Hemoglobin: 15.2 g/dL (ref 13.0–17.0)
Lymphocytes Relative: 23.1 % (ref 12.0–46.0)
MCHC: 32.9 g/dL (ref 30.0–36.0)
Monocytes Relative: 6.2 % (ref 3.0–12.0)
Neutro Abs: 8.9 10*3/uL — ABNORMAL HIGH (ref 1.4–7.7)
Neutrophils Relative %: 67.9 % (ref 43.0–77.0)
RBC: 4.99 Mil/uL (ref 4.22–5.81)

## 2012-03-17 LAB — HEPATIC FUNCTION PANEL
ALT: 25 U/L (ref 0–53)
Alkaline Phosphatase: 84 U/L (ref 39–117)
Bilirubin, Direct: 0.1 mg/dL (ref 0.0–0.3)
Total Bilirubin: 0.7 mg/dL (ref 0.3–1.2)
Total Protein: 7.3 g/dL (ref 6.0–8.3)

## 2012-03-17 LAB — BASIC METABOLIC PANEL
CO2: 29 mEq/L (ref 19–32)
Calcium: 9.6 mg/dL (ref 8.4–10.5)
Creatinine, Ser: 1 mg/dL (ref 0.4–1.5)
GFR: 85.63 mL/min (ref >60.00)
Sodium: 140 mEq/L (ref 135–145)

## 2012-03-17 NOTE — Patient Instructions (Addendum)
Your physician recommends that you have lab work today: CBC, BMP, LIVER and TSH  A chest x-ray takes a picture of the organs and structures inside the chest, including the heart, lungs, and blood vessels. This test can show several things, including, whether the heart is enlarges; whether fluid is building up in the lungs; and whether pacemaker / defibrillator leads are still in place.  We will call you with a plan of care once Dr Excell Seltzer reviews the results of lab work and x-ray.  Your physician recommends that you schedule a follow-up appointment in: 3 MONTHS

## 2012-03-17 NOTE — Assessment & Plan Note (Signed)
Tobacco cessation counseling was done. The patient understands that this is the most important intervention he can make to reduce his long-term cardiac risk.

## 2012-03-17 NOTE — Assessment & Plan Note (Signed)
The patient is on a reasonable medical program with a beta blocker and ARB. I think he should probably be started on more aggressive antianginal therapy with a long-acting nitrate or the addition of amlodipine. I have personally reviewed his cardiac catheterization films and he does have a long segment of disease throughout his mid LAD. If he does not have a good initial response to increased medical therapy, I would be inclined to repeat his cardiac catheterization and performed pressure wire analysis of the LAD. Because he has generalized symptoms of progressive fatigue and weakness as well as tachycardia at rest, I am going to check some lab work. A CBC, metabolic panel, and TSH will be drawn. I will call him when these results are available and will adjust his medicines at that time. In addition, I will check a chest x-ray today.

## 2012-03-17 NOTE — Assessment & Plan Note (Addendum)
Blood pressure control is suboptimal. Will likely increase losartan and Toprol-XL and may even add amlodipine. Await blood work before changing medicines

## 2012-03-17 NOTE — Progress Notes (Signed)
   HPI:  61 year old gentleman presented for followup evaluation. The patient has moderate coronary artery disease and he underwent cardiac catheterization last year after being evaluated for exertional chest discomfort. Medical therapy was recommended and he presents today for followup evaluation. At the time he was noted to have multiple poorly controlled risk factors. This included uncontrolled hypertension and continued tobacco use.  The patient does not feel well. He complains of marked fatigue. He's noticed a fast heartbeat over the last several days and feels some chest tightness with exertion. He continues to smoke cigarettes up to one pack per day. He denies fevers, chills, or night sweats. He denies orthopnea or PND. He does admit to progressive exertional dyspnea. His chest pain symptoms are not frequent. He notes that his blood pressure has been elevated much of the time.  Outpatient Encounter Prescriptions as of 03/17/2012  Medication Sig Dispense Refill  . aspirin (ASPIRIN CHILDRENS) 81 MG chewable tablet Chew 1 tablet (81 mg total) by mouth daily.  36 tablet  11  . losartan (COZAAR) 50 MG tablet Take 1 tablet (50 mg total) by mouth daily.  30 tablet  11  . metoprolol succinate (TOPROL XL) 50 MG 24 hr tablet Take 1 tablet (50 mg total) by mouth daily. Take with or immediately following a meal.  30 tablet  11  . nitroGLYCERIN (NITROSTAT) 0.4 MG SL tablet Place 1 tablet (0.4 mg total) under the tongue every 5 (five) minutes as needed for chest pain.  25 tablet  3  . rosuvastatin (CRESTOR) 10 MG tablet Take 1 tablet (10 mg total) by mouth at bedtime.  30 tablet  11    No Known Allergies  Past Medical History  Diagnosis Date  . CAD (coronary artery disease)     LHC 1/08: mLAD 30-50%, mCFX 30%, left PDA 30%,  EF 55%;    Myoview  07/31/11: EF 54%, no scar or ischemia, + chest pain, + hypotensive response  . Hypertension   . Hyperlipidemia   . Chest pain     ROS: Negative except as per  HPI  BP 159/82  Pulse 100  Ht 5' 8.5" (1.74 m)  Wt 194 lb 12.8 oz (88.361 kg)  BMI 29.19 kg/m2  PHYSICAL EXAM: Pt is alert and oriented, very pleasant overweight male in NAD HEENT: normal Neck: JVP - normal, carotids 2+= without bruits Lungs: CTA bilaterally CV: RRR without murmur or gallop Abd: soft, NT, Positive BS, no hepatomegaly, obese Ext: no C/C/E, distal pulses intact and equal Skin: warm/dry no rash  EKG:  Sinus tachycardia 102 beats per minute, otherwise within normal limits  ASSESSMENT AND PLAN:

## 2012-03-24 ENCOUNTER — Other Ambulatory Visit: Payer: Self-pay

## 2012-03-24 DIAGNOSIS — I2510 Atherosclerotic heart disease of native coronary artery without angina pectoris: Secondary | ICD-10-CM

## 2012-03-24 DIAGNOSIS — I10 Essential (primary) hypertension: Secondary | ICD-10-CM

## 2012-03-24 DIAGNOSIS — E7800 Pure hypercholesterolemia, unspecified: Secondary | ICD-10-CM

## 2012-03-24 DIAGNOSIS — D72829 Elevated white blood cell count, unspecified: Secondary | ICD-10-CM

## 2012-03-24 MED ORDER — LOSARTAN POTASSIUM 100 MG PO TABS: 100.0000 mg | ORAL_TABLET | Freq: Every day | ORAL | Status: DC

## 2012-03-24 MED ORDER — METOPROLOL SUCCINATE ER 100 MG PO TB24: 100.0000 mg | ORAL_TABLET | Freq: Every day | ORAL | Status: DC

## 2012-03-25 ENCOUNTER — Telehealth: Payer: Self-pay | Admitting: Oncology

## 2012-03-25 NOTE — Telephone Encounter (Signed)
S/w pt. In ref to NP appt. 9/16 @ 3:30  Ref. Dr. Excell Seltzer Dx-leukocytosis Mailed np packet

## 2012-03-25 NOTE — Telephone Encounter (Signed)
C/D on 9/4 for appt 9/16

## 2012-04-02 ENCOUNTER — Encounter: Payer: Self-pay | Admitting: Oncology

## 2012-04-02 DIAGNOSIS — D72829 Elevated white blood cell count, unspecified: Secondary | ICD-10-CM | POA: Insufficient documentation

## 2012-04-05 NOTE — Patient Instructions (Addendum)
1.  Issue:  Leukocytosis (elevated white blood cell WBC) 2.  Potential causes:  Most likely reactive or resolved infection.  If persists or worsens or also with anemia or low platelet count, then there will be more concern with a bone marrow process.  3.  Recommendation:  Observation at this time.  [facility] in about 4 and 8 months.  Return visit in about 1 year.

## 2012-04-06 ENCOUNTER — Other Ambulatory Visit (HOSPITAL_BASED_OUTPATIENT_CLINIC_OR_DEPARTMENT_OTHER): Payer: BC Managed Care – PPO | Admitting: Lab

## 2012-04-06 ENCOUNTER — Telehealth: Payer: Self-pay | Admitting: Oncology

## 2012-04-06 ENCOUNTER — Ambulatory Visit: Payer: BC Managed Care – PPO

## 2012-04-06 ENCOUNTER — Encounter: Payer: Self-pay | Admitting: Oncology

## 2012-04-06 ENCOUNTER — Ambulatory Visit (HOSPITAL_BASED_OUTPATIENT_CLINIC_OR_DEPARTMENT_OTHER): Payer: BC Managed Care – PPO | Admitting: Oncology

## 2012-04-06 VITALS — BP 139/81 | HR 84 | Temp 98.4°F | Resp 20 | Ht 68.5 in | Wt 194.7 lb

## 2012-04-06 DIAGNOSIS — F17200 Nicotine dependence, unspecified, uncomplicated: Secondary | ICD-10-CM

## 2012-04-06 DIAGNOSIS — R0609 Other forms of dyspnea: Secondary | ICD-10-CM

## 2012-04-06 DIAGNOSIS — R0989 Other specified symptoms and signs involving the circulatory and respiratory systems: Secondary | ICD-10-CM

## 2012-04-06 DIAGNOSIS — D72829 Elevated white blood cell count, unspecified: Secondary | ICD-10-CM

## 2012-04-06 DIAGNOSIS — I10 Essential (primary) hypertension: Secondary | ICD-10-CM

## 2012-04-06 LAB — CBC WITH DIFFERENTIAL/PLATELET
Basophils Absolute: 0.1 10*3/uL (ref 0.0–0.1)
EOS%: 3 % (ref 0.0–7.0)
Eosinophils Absolute: 0.3 10*3/uL (ref 0.0–0.5)
HGB: 14.9 g/dL (ref 13.0–17.1)
LYMPH%: 24.5 % (ref 14.0–49.0)
MCH: 30.2 pg (ref 27.2–33.4)
MCV: 92.5 fL (ref 79.3–98.0)
MONO%: 6.4 % (ref 0.0–14.0)
NEUT#: 7 10*3/uL — ABNORMAL HIGH (ref 1.5–6.5)
Platelets: 343 10*3/uL (ref 140–400)

## 2012-04-06 NOTE — Telephone Encounter (Signed)
Gave pt appt for January 2014 , March and September 2014

## 2012-04-07 ENCOUNTER — Encounter: Payer: Self-pay | Admitting: Oncology

## 2012-04-07 NOTE — Progress Notes (Signed)
[name]  Telephone:([phone]) [phone] [phone]) [phone]     INITIAL HEMATOLOGY CONSULTATION    Referral MD:  Dr. Tonny Bollman, M.D.  Reason for Referral: leukocytosis.     HPI: Mr. [name]. Wheeler is a 61 year-old man with history of HTN, HLP, CAD, current smoker; not ready to quit.  He has been in usual state of health until a few months ago when he developed DOE.  He also has URI symptoms last few weeks with nasal congestion, rhinorrhea.  He was evaluated by Dr. Excell Seltzer with CBC on 03/17/2012 which showed WBC 13.1; Hgb 15.2; Plt 339.  He was thus kindly referred to the [facility] for evaluation.  Of note, his CBC goes back as far as 08/18/2006 when WBC was 12.9.   Mr. [name] presented to the clinic by himself today.  He has mild to moderate DOE, wheezing.  He has not tried to stop smoking to see if this improves.  He denied fever, hemoptysis, chest pain, anorexia, weight loss, pleurisy.    Patient denies fever, anorexia, weight loss, fatigue, headache, visual changes, confusion, drenching night sweats, palpable lymph node swelling, mucositis, odynophagia, dysphagia, nausea vomiting, jaundice, chest pain, palpitation, productive cough, gum bleeding, epistaxis, hematemesis, hemoptysis, abdominal pain, abdominal swelling, early satiety, melena, hematochezia, hematuria, skin rash, spontaneous bleeding, joint swelling, joint pain, heat or cold intolerance, bowel bladder incontinence, back pain, focal motor weakness, paresthesia, depression, suicidal or homicidal ideation, feeling hopelessness.    Past Medical History  Diagnosis Date  . CAD (coronary artery disease)     LHC 1/08: mLAD 30-50%, mCFX 30%, left PDA 30%,  EF 55%;    Myoview  07/31/11: EF 54%, no scar or ischemia, + chest pain, + hypotensive response  . Hypertension   . Hyperlipidemia   . Chest pain   . Leukocytosis   :    Past Surgical History  Procedure Date  . Appendectomy   . Hemorrhoid surgery     :   CURRENT MEDS: Current Outpatient Prescriptions  Medication Sig Dispense Refill  . aspirin (ASPIRIN CHILDRENS) 81 MG chewable tablet Chew 1 tablet (81 mg total) by mouth daily.  36 tablet  11  . losartan (COZAAR) 100 MG tablet Take 1 tablet (100 mg total) by mouth daily.  30 tablet  11  . metoprolol succinate (TOPROL XL) 100 MG 24 hr tablet Take 1 tablet (100 mg total) by mouth daily. Take with or immediately following a meal.  30 tablet  11  . nitroGLYCERIN (NITROSTAT) 0.4 MG SL tablet Place 1 tablet (0.4 mg total) under the tongue every 5 (five) minutes as needed for chest pain.  25 tablet  3  . rosuvastatin (CRESTOR) 10 MG tablet Take 1 tablet (10 mg total) by mouth at bedtime.  30 tablet  11      No Known Allergies:  Family History  Problem Relation Age of Onset  . Coronary artery disease      Father with MI in his 49s  . Heart attack Father   . Cancer Brother     lung  . Heart attack Paternal Grandfather   . Stroke Brother   . Coronary artery disease Brother   . Heart attack Brother   :  History   Social History  . Marital Status: Single    Spouse Name: N/A    Number of Children: N/A  . Years of Education: N/A   Occupational History  . Not on file.   Social  History Main Topics  . Smoking status: Current Every Day Smoker  . Smokeless tobacco: Not on file  . Alcohol Use: No  . Drug Use: Not on file  . Sexually Active: Not on file   Other Topics Concern  . Not on file   Social History Narrative  . No narrative on file  :  REVIEW OF SYSTEM:  The rest of the 14-point review of sytem was negative.   Exam: ECOG 1  General:  well-nourished man, in no acute distress.  Eyes:  no scleral icterus.  ENT:  There were no oropharyngeal lesions.  Neck was without thyromegaly.  Lymphatics:  Negative cervical, supraclavicular or axillary adenopathy.  Respiratory: lungs showed bibasilar expiratory wheezing without increased work of breathing.  Cardiovascular:   Regular rate and rhythm, S1/S2, without murmur, rub or gallop.  There was no pedal edema.  GI:  abdomen was soft, flat, nontender, nondistended, without organomegaly.  Muscoloskeletal:  no spinal tenderness of palpation of vertebral spine.  Skin exam was without echymosis, petichae.  Neuro exam was nonfocal.  Patient was able to get on and off exam table without assistance.  Gait was normal.  Patient was alerted and oriented.  Attention was good.   Language was appropriate.  Mood was normal without depression.  Speech was not pressured.  Thought content was not tangential.    LABS:  Lab Results  Component Value Date   WBC 10.7* 04/06/2012   HGB 14.9 04/06/2012   HCT 45.5 04/06/2012   PLT 343 04/06/2012   GLUCOSE 91 03/17/2012   ALT 25 03/17/2012   AST 19 03/17/2012   NA 140 03/17/2012   K 3.9 03/17/2012   CL 103 03/17/2012   CREATININE 1.0 03/17/2012   BUN 16 03/17/2012   CO2 29 03/17/2012   INR 1.0 08/07/2011    Dg Chest 2 View  03/17/2012  *RADIOLOGY REPORT*  Clinical Data: Short of breath.  Chest pain.  Hypertension.  CHEST - 2 VIEW  Comparison: 07/12/2011.  Findings: Cardiopericardial silhouette and mediastinal contours are within normal limits, unchanged from prior.  There is no airspace disease or effusion.  Mild bilateral pleural apical thickening appears similar to prior, asymmetrically worse on the left.  IMPRESSION: Stable appearance of the chest.  No acute cardiopulmonary disease.   Original Report Authenticated By: Andreas Newport, M.D.     Blood smear review:   I personally reviewed the patient's peripheral blood smear today.  There was isocytosis.  There was no peripheral blast.  There was no schistocytosis, spherocytosis, target cell, rouleaux formation, tear drop cell.  There was no giant platelets or platelet clumps.      ASSESSMENT AND PLAN:   1.  Hypertension:  On Losartan, Metoprolol per PCP.  2.  Hyperlipidemia:  On rosuvastatin per PCP.  3.  CAD:  He is on ASA, Losartan,  metoprolol and simvastatin. 4.  Smoking:  I explained to him his very high risk of CAD, CVA, cancer.  He is not ready to quit. 5.  DOE, wheezing on exam:  High clinical suspicion for COPD.  Pt never had PFT.  I advised him to discuss with Dr. Excell Seltzer to see if PFT is appropriate.  6.  Leukocytosis, neutrophil predominant:   - Potential causes:  Most likely reactive or resolved infection.  If persists or worsens or also with anemia or low platelet count, then there will be more concern with a bone marrow process.  My review of his blood smear today was  benign.   - Recommendation:  Observation at this time.  Follow up with [facility] at [facility] in about 4 and 8 months.  Return visit in about 1 year.    Thank you for this referral.    The length of time of the face-to-face encounter was 30 minutes. More than 50% of time was spent counseling and coordination of care.

## 2012-04-28 ENCOUNTER — Ambulatory Visit (INDEPENDENT_AMBULATORY_CARE_PROVIDER_SITE_OTHER): Payer: BC Managed Care – PPO | Admitting: Cardiovascular Disease

## 2012-04-28 ENCOUNTER — Encounter: Payer: Self-pay | Admitting: Cardiovascular Disease

## 2012-04-28 VITALS — BP 123/70 | HR 68 | Wt 198.0 lb

## 2012-04-28 DIAGNOSIS — I10 Essential (primary) hypertension: Secondary | ICD-10-CM

## 2012-04-28 DIAGNOSIS — I25810 Atherosclerosis of coronary artery bypass graft(s) without angina pectoris: Secondary | ICD-10-CM

## 2012-04-28 NOTE — Patient Instructions (Addendum)
Your physician wants you to follow-up in: 6 MONTHS  You will receive a reminder letter in the mail two months in advance. If you don't receive a letter, please call our office to schedule the follow-up appointment.  SCRIPT GIVEN TO HAVE LIPIDS DRAWN.

## 2012-04-28 NOTE — Progress Notes (Signed)
   HPI:  61 year old gentleman presenting for followup of coronary artery disease. He underwent cardiac catheterization in 2012 demonstrating moderately severe mid LAD stenosis. At that time he had multiple poorly controlled risk factors. His blood pressure medications have been adjusted and he presents today for followup evaluation.  The patient has been feeling better. He denies any recent chest pain or chest pressure. He hasn't been short of breath but states that he hasn't been pushing himself physically. Unfortunately his brother just passed away within the last week. The patient has been taking his medications. He has cut back on cigarettes, now smoking one half pack per day to  Outpatient Encounter Prescriptions as of 04/28/2012  Medication Sig Dispense Refill  . aspirin (ASPIRIN CHILDRENS) 81 MG chewable tablet Chew 1 tablet (81 mg total) by mouth daily.  36 tablet  11  . losartan (COZAAR) 100 MG tablet Take 1 tablet (100 mg total) by mouth daily.  30 tablet  11  . metoprolol succinate (TOPROL XL) 100 MG 24 hr tablet Take 1 tablet (100 mg total) by mouth daily. Take with or immediately following a meal.  30 tablet  11  . nitroGLYCERIN (NITROSTAT) 0.4 MG SL tablet Place 1 tablet (0.4 mg total) under the tongue every 5 (five) minutes as needed for chest pain.  25 tablet  3  . rosuvastatin (CRESTOR) 10 MG tablet Take 1 tablet (10 mg total) by mouth at bedtime.  30 tablet  11    No Known Allergies  Past Medical History  Diagnosis Date  . CAD (coronary artery disease)     LHC 1/08: mLAD 30-50%, mCFX 30%, left PDA 30%,  EF 55%;    Myoview  07/31/11: EF 54%, no scar or ischemia, + chest pain, + hypotensive response  . Hypertension   . Hyperlipidemia   . Chest pain   . Leukocytosis     ROS: Negative except as per HPI  BP 123/70  Pulse 68  Wt 89.812 kg (198 lb)  PHYSICAL EXAM: Pt is alert and oriented, NAD HEENT: normal Neck: JVP - normal, carotids 2+= without bruits Lungs: CTA  bilaterally CV: RRR without murmur or gallop Abd: soft, NT, Positive BS, no hepatomegaly Ext: no C/C/E, distal pulses intact and equal Skin: warm/dry no rash  EKG:  Sinus rhythm, with a single fusion complex, heart rate 68 beats per minute, within normal limits.  ASSESSMENT AND PLAN: 1. Coronary artery disease, native vessel. The patient is stable without anginal symptoms. He has better control of his cardiovascular risk factors. He will continue on his current medical program. I would like to see him back in 6 months. His most recent nuclear scan from January 2013 was reviewed and it showed no significant ischemia.  2. Essential hypertension. Blood pressure is much better controlled. We'll continue on the combination of losartan and metoprolol succinate  3. Hyperlipidemia. The patient is on Crestor 10 mg daily. He is due for a lipid panel and this will be scheduled.  4. Tobacco. Cessation counseling was done. He is decreased to one half pack per day but isn't confident that he'll be adequate.  For followup I would like to see the patient back in 6 months.  Tonny Bollman 04/28/2012 3:25 PM

## 2012-05-02 ENCOUNTER — Ambulatory Visit (INDEPENDENT_AMBULATORY_CARE_PROVIDER_SITE_OTHER): Payer: BC Managed Care – PPO | Admitting: Internal Medicine

## 2012-05-02 VITALS — BP 169/84 | HR 66 | Temp 97.7°F | Resp 16 | Ht 68.0 in | Wt 196.0 lb

## 2012-05-02 DIAGNOSIS — E785 Hyperlipidemia, unspecified: Secondary | ICD-10-CM

## 2012-05-02 DIAGNOSIS — Z23 Encounter for immunization: Secondary | ICD-10-CM

## 2012-05-02 DIAGNOSIS — J449 Chronic obstructive pulmonary disease, unspecified: Secondary | ICD-10-CM

## 2012-05-02 DIAGNOSIS — I10 Essential (primary) hypertension: Secondary | ICD-10-CM

## 2012-05-02 DIAGNOSIS — I2510 Atherosclerotic heart disease of native coronary artery without angina pectoris: Secondary | ICD-10-CM

## 2012-05-02 DIAGNOSIS — F17200 Nicotine dependence, unspecified, uncomplicated: Secondary | ICD-10-CM

## 2012-05-02 LAB — POCT CBC
Hemoglobin: 14.1 g/dL (ref 14.1–18.1)
Lymph, poc: 1.8 (ref 0.6–3.4)
MCH, POC: 29.4 pg (ref 27–31.2)
MCHC: 30.7 g/dL — AB (ref 31.8–35.4)
MID (cbc): 0.5 (ref 0–0.9)
MPV: 8.9 fL (ref 0–99.8)
POC Granulocyte: 6.1 (ref 2–6.9)
POC LYMPH PERCENT: 21.6 %L (ref 10–50)
POC MID %: 6.2 %M (ref 0–12)
Platelet Count, POC: 357 10*3/uL (ref 142–424)
RDW, POC: 13 %
WBC: 8.5 10*3/uL (ref 4.6–10.2)

## 2012-05-02 LAB — COMPREHENSIVE METABOLIC PANEL
Albumin: 4.2 g/dL (ref 3.5–5.2)
Alkaline Phosphatase: 86 U/L (ref 39–117)
BUN: 16 mg/dL (ref 6–23)
Creat: 0.83 mg/dL (ref 0.50–1.35)
Glucose, Bld: 104 mg/dL — ABNORMAL HIGH (ref 70–99)
Potassium: 5 mEq/L (ref 3.5–5.3)

## 2012-05-02 LAB — LIPID PANEL
HDL: 46 mg/dL (ref >39)
LDL Cholesterol: 146 mg/dL — ABNORMAL HIGH (ref 0–99)
Total CHOL/HDL Ratio: 4.5 Ratio
Triglycerides: 72 mg/dL (ref <150)

## 2012-05-02 MED ORDER — HYDROCHLOROTHIAZIDE 12.5 MG PO CAPS: 12.5000 mg | ORAL_CAPSULE | Freq: Every day | ORAL | Status: DC

## 2012-05-02 MED ORDER — ROSUVASTATIN CALCIUM 20 MG PO TABS: 20.0000 mg | ORAL_TABLET | Freq: Every day | ORAL | Status: DC

## 2012-05-02 NOTE — Progress Notes (Addendum)
Subjective:    Patient ID: [name], male    DOB: 12-16-1950, 61 y.o.   MRN: [id number]  HPIsent by cardiologist for labs to include lipid profile and LFTs. At last OV here 07/12/11 he collapsed in office, was transferred emergently to [facility] ER and there admitted for syncope with Influenza and chest pain. Given His  history of hypertension, hyperlipidemia and tobacco abuse( He had undergone diagnostic heart catheterization January 2008: Mid LAD 30-50%, mid circumflex 30%, left PDA 30%, EF 55% ) He was seen by Dr Jens Som at 06/2011 hospitalization , And Subsequently  had stress Myoview and cardiac catheterization which demonstrated moderate, long segment stenosis of the mid LAD. He was treated medically to approach all his risk factors. Current outpatient prescriptions:aspirin (ASPIRIN CHILDRENS) 81 MG chewable tablet, Chew 1 tablet (81 mg total) by mouth daily., Disp: 36 tablet, Rfl: 11;  losartan (COZAAR) 100 MG tablet, Take 1 tablet (100 mg total) by mouth daily., Disp: 30 tablet, Rfl: 11;  metoprolol succinate (TOPROL XL) 100 MG 24 hr tablet, Take 1 tablet (100 mg total) by mouth daily. Take with or immediately following a meal., Disp: 30 tablet, Rfl: 11 nitroGLYCERIN (NITROSTAT) 0.4 MG SL tablet, Place 1 tablet (0.4 mg total) under the tongue every 5 (five) minutes as needed for chest pain., Disp: 25 tablet, Rfl: 3;  rosuvastatin (CRESTOR) 10 MG tablet, Take 1 tablet (10 mg total) by mouth at bedtime., Disp: 30 tablet, Rfl: 11 He has done fairly well from a symptomatic standpoint and continues to work. 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 There were many episodes of respiratory disease thought secondary to smoking.   Hypertension was first noticed in June of 2000 and he was started on medication, but he did  not take his regularly until 2004.   He was first noted to have an elevated cholesterol and 2000 with an LDL of 182 and started on Lipitor but failed to continue this. He was restarted in March of 2001 and He was referred to Dr. Chales Abrahams - stress Cardiolite was normal. Crestor was added and reduced his LDL to 111 at a dose of 10 mg, but he failed followup until 2004 at which time his LDL was 196 and has been treated only sporadically since that time. He was referred to Dr. Excell Seltzer in 2008 and the above-mentioned cardiac catheterization was performed.   Persistent leucocytosis= 12,800 was first noted in February of 2008. Recent evaluation for leukocytosis Dr Gaylyn Rong suggests "reactive".  Pulmonary function studies in our office February 2008 revealed moderately severe restriction. Despite this he has never needed bronchodilators with any of his LRIs. His last office visit pertaining to hypertension, COPD, Nicotine addiction and Hyperlipidemia was in 2011. He was never able to achieve smoking cessation. August 2013 CHEST - 2 VIEW Comparison: 07/12/2011. Findings: Cardiopericardial silhouette and mediastinal contours are within normal limits, unchanged from prior. There is no airspace disease or effusion. Mild bilateral pleural apical thickening appears similar to prior, asymmetrically worse on the left. IMPRESSION: Stable appearance of the chest. No acute cardiopulmonary disease.  Family history suggests that coronary artery disease is rampant. His father and 2 Older brothers Have passed away with heart disease.  He is correctly asking for Pneumovax and flu vaccine today Continues to smoke half-pack cigarettes per day Had a history  of claudication which was worse last year but much better over the last 3-4 months.    Review of Systems No vision changes or headaches No hearing loss No recent chest pain or change in dyspnea No gastrointestinal symptoms/no hx of colonoscopy in our records Denies  genitourinary problems/PSA 1.1 2000 and 2005. Denies paresthesias or weakness in the extremities     Objective:   Physical Exam Filed Vitals:   05/02/12 0801  BP: 169/84  Pulse: 66  Temp: 97.7 F (36.5 C)  Resp: 16  HEENT clear No carotid bruits Heart regular without murmur Lungs have rhonchi and diminished sounds at bases but no wheezing with forced expiration which is prolonged No peripheral edema Peripheral pulses are diminished     Assessment & Plan:   Patient Active Problem List  Diagnosis  . HTN (hypertension)  . HLD (hyperlipidemia)  . Tobacco abuse  . CAD (coronary artery disease)  . Leukocytosis    -  COPD   -  Peripheral vascular disease  Results for orders placed in visit on 05/02/12  POCT CBC      Component Value Range   WBC 8.5  4.6 - 10.2 K/uL   Lymph, poc 1.8  0.6 - 3.4   POC LYMPH PERCENT 21.6  10 - 50 %L   MID (cbc) 0.5  0 - 0.9   POC MID % 6.2  0 - 12 %M   POC Granulocyte 6.1  2 - 6.9   Granulocyte percent 72.2  37 - 80 %G   RBC 4.79  4.69 - 6.13 M/uL   Hemoglobin 14.1  14.1 - 18.1 g/dL   HCT, POC 16.1  09.6 - 53.7 %   MCV 95.9  80 - 97 fL   MCH, POC 29.4  27 - 31.2 pg   MCHC 30.7 (*) 31.8 - 35.4 g/dL   RDW, POC 04.5     Platelet Count, POC 357  142 - 424 K/uL   MPV 8.9  0 - 99.8 fL  POCT GLYCOSYLATED HEMOGLOBIN (HGB A1C)      Component Value Range   Hemoglobin A1C 6.1    PSA      Component Value Range   PSA 1.56  <=4.00 ng/mL  COMPREHENSIVE METABOLIC PANEL      Component Value Range   Sodium 139  135 - 145 mEq/L   Potassium 5.0  3.5 - 5.3 mEq/L   Chloride 101  96 - 112 mEq/L   CO2 30  19 - 32 mEq/L   Glucose, Bld 104 (*) 70 - 99 mg/dL   BUN 16  6 - 23 mg/dL   Creat 4.09  8.11 - 9.14 mg/dL   Total Bilirubin 0.7  0.3 - 1.2 mg/dL   Alkaline Phosphatase 86  39 - 117 U/L   AST 15  0 - 37 U/L   ALT 22  0 - 53 U/L   Total Protein 6.6  6.0 - 8.3 g/dL   Albumin 4.2  3.5 - 5.2 g/dL   Calcium 9.6  8.4 - 78.2 mg/dL  LIPID PANEL       Component Value Range   Cholesterol 206 (*) 0 - 200 mg/dL   Triglycerides 72  <956 mg/dL   HDL 46  >21 mg/dL   Total CHOL/HDL Ratio 4.5     VLDL 14  0 - 40 mg/dL   LDL Cholesterol 308 (*) 0 - 99 mg/dL   At this point it would seem prudent to add hydrochlorothiazide to  his current antihypertensive regimen, and to increase his Crestor to 20 mg, then repeat these lab in 3 months either here or with Dr Excell Seltzer. Meds ordered this encounter  Medications  . hydrochlorothiazide (MICROZIDE) 12.5 MG capsule    Sig: Take 1 capsule (12.5 mg total) by mouth daily with breakfast.    Dispense:  90 capsule    Refill:  3  . rosuvastatin (CRESTOR) 20 MG tablet    Sig: Take 1 tablet (20 mg total) by mouth daily.    Dispense:  30 tablet    Refill:  2   He will followup with Dr. Excell Seltzer soon and we will send these results to him He would not accept a referral to pulmonology today but I think his quality of life would be greatly improved and he was known pulmonary therapy. He may need further investigation of his peripheral vascular disease He did not want to commit to colonoscopy He smokes most of his cigarettes in the morning with coffee, and suggested it would be very difficult for him to give those up He has been invited to schedule a complete physical examination and our appointment office with Dr. Clelia Croft To try to address the rest of his health maintenance issues Flu vaccine and Pneumovax given today  OV/RPD

## 2012-07-20 ENCOUNTER — Ambulatory Visit (INDEPENDENT_AMBULATORY_CARE_PROVIDER_SITE_OTHER): Payer: BC Managed Care – PPO | Admitting: Family Medicine

## 2012-07-20 ENCOUNTER — Ambulatory Visit: Payer: BC Managed Care – PPO

## 2012-07-20 VITALS — BP 170/88 | HR 87 | Temp 98.5°F | Resp 18 | Ht 67.0 in | Wt 196.8 lb

## 2012-07-20 DIAGNOSIS — R109 Unspecified abdominal pain: Secondary | ICD-10-CM

## 2012-07-20 DIAGNOSIS — R0789 Other chest pain: Secondary | ICD-10-CM

## 2012-07-20 DIAGNOSIS — R509 Fever, unspecified: Secondary | ICD-10-CM

## 2012-07-20 DIAGNOSIS — R110 Nausea: Secondary | ICD-10-CM

## 2012-07-20 DIAGNOSIS — R6883 Chills (without fever): Secondary | ICD-10-CM

## 2012-07-20 DIAGNOSIS — R300 Dysuria: Secondary | ICD-10-CM

## 2012-07-20 DIAGNOSIS — J40 Bronchitis, not specified as acute or chronic: Secondary | ICD-10-CM

## 2012-07-20 LAB — POCT CBC
Granulocyte percent: 67.7 %G (ref 37–80)
Lymph, poc: 3.2 (ref 0.6–3.4)
MCH, POC: 29.7 pg (ref 27–31.2)
MCHC: 31 g/dL — AB (ref 31.8–35.4)
MCV: 95.7 fL (ref 80–97)
MID (cbc): 0.7 (ref 0–0.9)
POC LYMPH PERCENT: 26.4 %L (ref 10–50)
Platelet Count, POC: 424 10*3/uL (ref 142–424)
RDW, POC: 13.8 %

## 2012-07-20 LAB — POCT UA - MICROSCOPIC ONLY
Bacteria, U Microscopic: NEGATIVE
Mucus, UA: NEGATIVE
WBC, Ur, HPF, POC: NEGATIVE
Yeast, UA: NEGATIVE

## 2012-07-20 LAB — POCT URINALYSIS DIPSTICK
Bilirubin, UA: NEGATIVE
Ketones, UA: NEGATIVE
Leukocytes, UA: NEGATIVE
Nitrite, UA: NEGATIVE
Protein, UA: NEGATIVE

## 2012-07-20 LAB — POCT INFLUENZA A/B: Influenza B, POC: NEGATIVE

## 2012-07-20 MED ORDER — AZITHROMYCIN 250 MG PO TABS: ORAL_TABLET | ORAL | Status: DC

## 2012-07-20 MED ORDER — BENZONATATE 100 MG PO CAPS: 100.0000 mg | ORAL_CAPSULE | Freq: Three times a day (TID) | ORAL | Status: DC | PRN

## 2012-07-20 NOTE — Patient Instructions (Addendum)
Start the antibiotic tonight. The nausea is likely with the drainage.   mucinex if needed or tessalon perles as prescribed for cough, drink plenty of clear fluids - less tea. If abdominal pain and other symptoms not improving in the morning -  return to clinic for recheck. Return to the clinic or go to the nearest emergency room if any of your symptoms worsen or new symptoms occur, including but not limited to chest pain, heaviness, or any worsening nausea.  Recheck in next few days if your symptoms are improving.   Cough, Adult  A cough is a reflex that helps clear your throat and airways. It can help heal the body or may be a reaction to an irritated airway. A cough may only last 2 or 3 weeks (acute) or may last more than 8 weeks (chronic).  CAUSES Acute cough:  Viral or bacterial infections. Chronic cough:  Infections.  Allergies.  Asthma.  Post-nasal drip.  Smoking.  Heartburn or acid reflux.  Some medicines.  Chronic lung problems (COPD).  Cancer. SYMPTOMS   Cough.  Fever.  Chest pain.  Increased breathing rate.  High-pitched whistling sound when breathing (wheezing).  Colored mucus that you cough up (sputum). TREATMENT   A bacterial cough may be treated with antibiotic medicine.  A viral cough must run its course and will not respond to antibiotics.  Your caregiver may recommend other treatments if you have a chronic cough. HOME CARE INSTRUCTIONS   Only take over-the-counter or prescription medicines for pain, discomfort, or fever as directed by your caregiver. Use cough suppressants only as directed by your caregiver.  Use a cold steam vaporizer or humidifier in your bedroom or home to help loosen secretions.  Sleep in a semi-upright position if your cough is worse at night.  Rest as needed.  Stop smoking if you smoke. SEEK IMMEDIATE MEDICAL CARE IF:   You have pus in your sputum.  Your cough starts to worsen.  You cannot control your cough with  suppressants and are losing sleep.  You begin coughing up blood.  You have difficulty breathing.  You develop pain which is getting worse or is uncontrolled with medicine.  You have a fever. MAKE SURE YOU:   Understand these instructions.  Will watch your condition.  Will get help right away if you are not doing well or get worse. Document Released: 01/04/2011 Document Revised: 09/30/2011 Document Reviewed: 01/04/2011 [facility] Patient Information 2013 [place], [place].

## 2012-07-20 NOTE — Progress Notes (Signed)
Subjective:    Patient ID: [name], male    DOB: 09/30/1950, 61 y.o.   MRN: [id number]  HPI [name] is a 61 y.o. male  Chills starting last night, nausea this am, coughed up some phlegm. No vomiting.  Chills and subjective fever today. 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  Minimal cough. Has had some tightness in chest only with coughing up phlegm, no chest pain. Has been drinking tea more than water.  Urinating more frequently and burning with urination today. Dehydrated from urination 1 year ago and had to spend 9 hours in ER.   Hx of recurrent bronchitis in past, and had had pneumonia.  Has inhalers at home if needed.   Hx of CAD, with MI in 1996.  S/p angioplasty x 3 - most recently 1 year ago.   Had flu and pneumonia shot in October.  No known sick contacts recently.   Tx: ASA - few taken after work today.    Review of Systems  Constitutional: Positive for fever (subjective - hasn't measured temp.  ) and chills.  HENT: Positive for ear pain (not hurting - just tingling. ). Negative for neck pain and neck stiffness.   Eyes: Positive for photophobia (light hurts his eyes. ).  Gastrointestinal: Positive for nausea. Negative for vomiting and abdominal pain (lower abdomen - naval area.Marland Kitchen).  Genitourinary: Positive for dysuria, urgency and frequency. Negative for hematuria.  Skin: Negative for rash.       Objective:   Physical Exam  Vitals reviewed. Constitutional: He is oriented to person, place, and time. He appears well-developed and well-nourished.  HENT:  Head: Atraumatic. Macrocephalic.  Right Ear: Tympanic membrane, external ear and ear canal normal.  Left Ear: Tympanic membrane, external ear and ear canal normal.  Nose: No rhinorrhea.  Mouth/Throat: Uvula is midline and mucous membranes are normal. Posterior oropharyngeal erythema present. No oropharyngeal exudate, posterior oropharyngeal edema or tonsillar abscesses.  Eyes: Conjunctivae normal are  normal. Pupils are equal, round, and reactive to light.  Neck: Neck supple.  Cardiovascular: Normal rate, regular rhythm, normal heart sounds and intact distal pulses.   No murmur heard. Pulmonary/Chest: Effort normal and breath sounds normal. No respiratory distress. He has no wheezes. He has no rhonchi. He has no rales.       Clear.  Abdominal: Soft. There is tenderness (minimal suprapubic. ).  Lymphadenopathy:    He has no cervical adenopathy.  Neurological: He is alert and oriented to person, place, and time.  Skin: Skin is warm and dry. No rash noted.  Psychiatric: He has a normal mood and affect. His behavior is normal.   EKG: sinus rhythm, No acute findings or apparent change from prior.  [facility] reading (PRIMARY) by  Dr. Neva Seat: CXR; ?diffuse scar without discrete infiltrate.   Results for orders placed in visit on 07/20/12  POCT URINALYSIS DIPSTICK      Component Value Range   Color, UA yellow     Clarity, UA clear     Glucose, UA neg     Bilirubin, UA neg     Ketones, UA neg     Spec Grav, UA 1.025     Blood, UA neg     pH, UA 5.5     Protein, UA neg     Urobilinogen, UA 0.2     Nitrite, UA neg     Leukocytes, UA Negative    POCT UA - MICROSCOPIC ONLY      Component  Value Range   WBC, Ur, HPF, POC neg     RBC, urine, microscopic neg     Bacteria, U Microscopic neg     Mucus, UA neg     Epithelial cells, urine per micros neg     Crystals, Ur, HPF, POC neg     Casts, Ur, LPF, POC neg     Yeast, UA neg    POCT CBC      Component Value Range   WBC 12.3 (*) 4.6 - 10.2 K/uL   Lymph, poc 3.2  0.6 - 3.4   POC LYMPH PERCENT 26.4  10 - 50 %L   MID (cbc) 0.7  0 - 0.9   POC MID % 5.9  0 - 12 %M   POC Granulocyte 8.3 (*) 2 - 6.9   Granulocyte percent 67.7  37 - 80 %G   RBC 4.95  4.69 - 6.13 M/uL   Hemoglobin 14.7  14.1 - 18.1 g/dL   HCT, POC 16.1  09.6 - 53.7 %   MCV 95.7  80 - 97 fL   MCH, POC 29.7  27 - 31.2 pg   MCHC 31.0 (*) 31.8 - 35.4 g/dL   RDW, POC 04.5      Platelet Count, POC 424  142 - 424 K/uL   MPV 8.1  0 - 99.8 fL  POCT INFLUENZA A/B      Component Value Range   Influenza A, POC Negative     Influenza B, POC Negative         Assessment & Plan:  [name] is a 61 y.o. male 1. Chest tightness  EKG 12-Lead, POCT CBC, DG Chest 2 View  2. Fever  POCT CBC, DG Chest 2 View, POCT Influenza A/B  3. Chills  POCT CBC, DG Chest 2 View, POCT Influenza A/B  4. Dysuria  POCT urinalysis dipstick, POCT UA - Microscopic Only, POCT CBC, DG Chest 2 View, POCT Influenza A/B  5. Abdominal pain  POCT CBC, DG Chest 2 View   Subjective f/c, with cough and chest tightness only with coughing. No apparent change on EKG, and hx of recurrent bronchitis.  Slight leukocytosis. Will treat as early CAP or bronchitis, with close follow up. Zpak, mucinex or tessalon if needed.   Abd pain, dysuria. - reassurring U/a.  Increase water. Less tea, recheck tomorrow if still with abd pain.  Overnight ER precautions discussed.   Patient Instructions  Start the antibiotic tonight. The nausea is likely with the drainage.   mucinex if needed or tessalon perles as prescribed for cough, drink plenty of clear fluids - less tea. If abdominal pain and other symptoms not improving in the morning -  return to clinic for recheck. Return to the clinic or go to the nearest emergency room if any of your symptoms worsen or new symptoms occur, including but not limited to chest pain, heaviness, or any worsening nausea.  Recheck in next few days if your symptoms are improving.   Cough, Adult  A cough is a reflex that helps clear your throat and airways. It can help heal the body or may be a reaction to an irritated airway. A cough may only last 2 or 3 weeks (acute) or may last more than 8 weeks (chronic).  CAUSES Acute cough:  Viral or bacterial infections. Chronic cough:  Infections.  Allergies.  Asthma.  Post-nasal drip.  Smoking.  Heartburn or acid reflux.  Some  medicines.  Chronic lung problems (COPD).  Cancer. SYMPTOMS   Cough.  Fever.  Chest pain.  Increased breathing rate.  High-pitched whistling sound when breathing (wheezing).  Colored mucus that you cough up (sputum). TREATMENT   A bacterial cough may be treated with antibiotic medicine.  A viral cough must run its course and will not respond to antibiotics.  Your caregiver may recommend other treatments if you have a chronic cough. HOME CARE INSTRUCTIONS   Only take over-the-counter or prescription medicines for pain, discomfort, or fever as directed by your caregiver. Use cough suppressants only as directed by your caregiver.  Use a cold steam vaporizer or humidifier in your bedroom or home to help loosen secretions.  Sleep in a semi-upright position if your cough is worse at night.  Rest as needed.  Stop smoking if you smoke. SEEK IMMEDIATE MEDICAL CARE IF:   You have pus in your sputum.  Your cough starts to worsen.  You cannot control your cough with suppressants and are losing sleep.  You begin coughing up blood.  You have difficulty breathing.  You develop pain which is getting worse or is uncontrolled with medicine.  You have a fever. MAKE SURE YOU:   Understand these instructions.  Will watch your condition.  Will get help right away if you are not doing well or get worse. Document Released: 01/04/2011 Document Revised: 09/30/2011 Document Reviewed: 01/04/2011 [facility] Patient Information 2013 [place], [place].

## 2012-08-06 ENCOUNTER — Other Ambulatory Visit (HOSPITAL_BASED_OUTPATIENT_CLINIC_OR_DEPARTMENT_OTHER): Payer: BC Managed Care – PPO | Admitting: Lab

## 2012-08-06 ENCOUNTER — Telehealth: Payer: Self-pay | Admitting: *Deleted

## 2012-08-06 DIAGNOSIS — D72829 Elevated white blood cell count, unspecified: Secondary | ICD-10-CM

## 2012-08-06 LAB — CBC WITH DIFFERENTIAL/PLATELET
Basophils Absolute: 0.1 10*3/uL (ref 0.0–0.1)
Eosinophils Absolute: 0.3 10*3/uL (ref 0.0–0.5)
HCT: 42.5 % (ref 38.4–49.9)
MCV: 92 fL (ref 79.3–98.0)
MONO#: 0.6 10*3/uL (ref 0.1–0.9)
Platelets: 347 10*3/uL (ref 140–400)
RBC: 4.62 10*6/uL (ref 4.20–5.82)
RDW: 13.6 % (ref 11.0–14.6)
lymph#: 3.2 10*3/uL (ref 0.9–3.3)
nRBC: 0 % (ref 0–0)

## 2012-08-06 NOTE — Telephone Encounter (Signed)
Message copied by Wende Mott on Thu Aug 06, 2012  4:57 PM ------      Message from: HA, Raliegh Ip T      Created: Thu Aug 06, 2012  4:38 PM       Please call pt.  His elevated WBC is now much better.  Most likely this is from reactive process and not a bone marrow issue.  Continue observation.  Thanks.

## 2012-08-06 NOTE — Telephone Encounter (Signed)
Called pt w/ Dr. Lodema Pilot message below.  Keep next lab appt as scheduled in May.  He verbalized understanding.

## 2012-11-15 ENCOUNTER — Ambulatory Visit (INDEPENDENT_AMBULATORY_CARE_PROVIDER_SITE_OTHER): Payer: BC Managed Care – PPO | Admitting: Family Medicine

## 2012-11-15 ENCOUNTER — Emergency Department (HOSPITAL_COMMUNITY): Payer: BC Managed Care – PPO

## 2012-11-15 ENCOUNTER — Emergency Department (HOSPITAL_COMMUNITY)
Admission: EM | Admit: 2012-11-15 | Discharge: 2012-11-15 | Disposition: A | Discharge status: Home / Self Care | Payer: BC Managed Care – PPO | Attending: Emergency Medicine | Admitting: Emergency Medicine

## 2012-11-15 ENCOUNTER — Encounter (HOSPITAL_COMMUNITY): Payer: Self-pay | Admitting: Emergency Medicine

## 2012-11-15 VITALS — BP 174/82 | HR 87 | Temp 97.7°F | Resp 18 | Ht 67.0 in | Wt 196.0 lb

## 2012-11-15 DIAGNOSIS — Z7982 Long term (current) use of aspirin: Secondary | ICD-10-CM | POA: Insufficient documentation

## 2012-11-15 DIAGNOSIS — R5381 Other malaise: Secondary | ICD-10-CM | POA: Insufficient documentation

## 2012-11-15 DIAGNOSIS — D72829 Elevated white blood cell count, unspecified: Secondary | ICD-10-CM

## 2012-11-15 DIAGNOSIS — R29898 Other symptoms and signs involving the musculoskeletal system: Secondary | ICD-10-CM

## 2012-11-15 DIAGNOSIS — R0602 Shortness of breath: Secondary | ICD-10-CM | POA: Insufficient documentation

## 2012-11-15 DIAGNOSIS — D62 Acute posthemorrhagic anemia: Secondary | ICD-10-CM

## 2012-11-15 DIAGNOSIS — Z862 Personal history of diseases of the blood and blood-forming organs and certain disorders involving the immune mechanism: Secondary | ICD-10-CM | POA: Insufficient documentation

## 2012-11-15 DIAGNOSIS — I2510 Atherosclerotic heart disease of native coronary artery without angina pectoris: Secondary | ICD-10-CM | POA: Insufficient documentation

## 2012-11-15 DIAGNOSIS — R5383 Other fatigue: Secondary | ICD-10-CM | POA: Insufficient documentation

## 2012-11-15 DIAGNOSIS — E785 Hyperlipidemia, unspecified: Secondary | ICD-10-CM | POA: Insufficient documentation

## 2012-11-15 DIAGNOSIS — R209 Unspecified disturbances of skin sensation: Secondary | ICD-10-CM | POA: Insufficient documentation

## 2012-11-15 DIAGNOSIS — M25539 Pain in unspecified wrist: Secondary | ICD-10-CM | POA: Insufficient documentation

## 2012-11-15 DIAGNOSIS — R200 Anesthesia of skin: Secondary | ICD-10-CM

## 2012-11-15 DIAGNOSIS — I10 Essential (primary) hypertension: Secondary | ICD-10-CM | POA: Insufficient documentation

## 2012-11-15 DIAGNOSIS — Z79899 Other long term (current) drug therapy: Secondary | ICD-10-CM | POA: Insufficient documentation

## 2012-11-15 DIAGNOSIS — K921 Melena: Secondary | ICD-10-CM

## 2012-11-15 DIAGNOSIS — F17200 Nicotine dependence, unspecified, uncomplicated: Secondary | ICD-10-CM | POA: Insufficient documentation

## 2012-11-15 LAB — POCT CBC
Granulocyte percent: 69.7 %G (ref 37–80)
HCT, POC: 42.1 % — AB (ref 43.5–53.7)
Hemoglobin: 12.8 g/dL — AB (ref 14.1–18.1)
Lymph, poc: 2.2 (ref 0.6–3.4)
MCH, POC: 28.5 pg (ref 27–31.2)
MCHC: 30.4 g/dL — AB (ref 31.8–35.4)
MCV: 93.7 fL (ref 80–97)
MID (cbc): 0.5 (ref 0–0.9)
MPV: 8.1 fL (ref 0–99.8)
POC Granulocyte: 6.2 (ref 2–6.9)
POC LYMPH PERCENT: 24.4 %L (ref 10–50)
POC MID %: 5.9 %M (ref 0–12)
Platelet Count, POC: 387 10*3/uL (ref 142–424)
RBC: 4.49 M/uL — AB (ref 4.69–6.13)
RDW, POC: 14.5 %
WBC: 8.9 10*3/uL (ref 4.6–10.2)

## 2012-11-15 LAB — COMPREHENSIVE METABOLIC PANEL
ALT: 17 U/L (ref 0–53)
Albumin: 3.5 g/dL (ref 3.5–5.2)
Alkaline Phosphatase: 104 U/L (ref 39–117)
Potassium: 4 mEq/L (ref 3.5–5.1)
Sodium: 140 mEq/L (ref 135–145)
Total Protein: 7.2 g/dL (ref 6.0–8.3)

## 2012-11-15 LAB — CBC
HCT: 42.3 % (ref 39.0–52.0)
Platelets: 371 10*3/uL (ref 150–400)
RBC: 4.85 MIL/uL (ref 4.22–5.81)
RDW: 13.4 % (ref 11.5–15.5)
WBC: 11.5 10*3/uL — ABNORMAL HIGH (ref 4.0–10.5)

## 2012-11-15 MED ORDER — HYDROCODONE-ACETAMINOPHEN 5-325 MG PO TABS
1.0000 | ORAL_TABLET | Freq: Once | ORAL | Status: AC
  Administered 2012-11-15: 1 via ORAL
  Filled 2012-11-15: qty 1

## 2012-11-15 NOTE — ED Provider Notes (Signed)
History     CSN: [id number]  Arrival date & time 11/15/12  1310   First MD Initiated Contact with Patient 11/15/12 1341      Chief Complaint  Patient presents with  . Shortness of Breath  . Chest Pain    (Consider location/radiation/quality/duration/timing/severity/associated sxs/prior treatment) HPI [name] is a 62 y.o. male who presents to ED with complaint of left wrist pain and left arm and leg weakness. States has had pain in left wrist for about a week. 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 Since last night noted that his left hand and left leg were weak as well. Went to urgent care and was sent here with concern for possible stroke. Pt denies hx of a stroke, has had CAD with several stents. Pt denies headache. Denies visual changes. No chest pain, states just pressure. No nausea, vomiting, no abdominal pain. States taking ibuprofen for pain with no improvement.    Past Medical History  Diagnosis Date  . CAD (coronary artery disease)     LHC 1/08: mLAD 30-50%, mCFX 30%, left PDA 30%,  EF 55%;    Myoview  07/31/11: EF 54%, no scar or ischemia, + chest pain, + hypotensive response  . Hypertension   . Hyperlipidemia   . Chest pain   . Leukocytosis     Past Surgical History  Procedure Laterality Date  . Appendectomy    . Hemorrhoid surgery      Family History  Problem Relation Age of Onset  . Coronary artery disease      Father with MI in his 67s  . Heart attack Father   . Cancer Brother     lung  . Heart attack Paternal Grandfather   . Stroke Brother   . Coronary artery disease Brother   . Heart attack Brother     History  Substance Use Topics  . Smoking status: Current Every Day Smoker  . Smokeless tobacco: Not on file  . Alcohol Use: No      Review of Systems  Constitutional: Negative for fever and chills.  HENT: Negative for neck pain and neck stiffness.   Respiratory: Positive for chest tightness. Negative for choking,  shortness of breath and wheezing.   Cardiovascular: Negative.   Gastrointestinal: Negative.   Genitourinary: Negative.   Musculoskeletal: Positive for arthralgias.  Skin: Negative.   Neurological: Positive for weakness and numbness.    Allergies  Review of patient's allergies indicates no known allergies.  Home Medications   Current Outpatient Rx  Name  Route  Sig  Dispense  Refill  . aspirin EC 81 MG tablet   Oral   Take 81 mg by mouth daily.         Marland Kitchen losartan (COZAAR) 100 MG tablet   Oral   Take 1 tablet (100 mg total) by mouth daily.   30 tablet   11   . metoprolol succinate (TOPROL XL) 100 MG 24 hr tablet   Oral   Take 1 tablet (100 mg total) by mouth daily. Take with or immediately following a meal.   30 tablet   11   . nitroGLYCERIN (NITROSTAT) 0.4 MG SL tablet   Sublingual   Place 0.4 mg under the tongue every 5 (five) minutes as needed for chest pain.         . rosuvastatin (CRESTOR) 10 MG tablet   Oral   Take 10 mg by mouth daily.  BP 157/75  Pulse 94  Temp(Src) 97.8 F (36.6 C) (Oral)  Resp 18  SpO2 95%  Physical Exam  Nursing note and vitals reviewed. Constitutional: He is oriented to person, place, and time. He appears well-developed and well-nourished. No distress.  HENT:  Head: Normocephalic.  Eyes: Conjunctivae are normal. Pupils are equal, round, and reactive to light.  Cardiovascular: Normal rate, regular rhythm and normal heart sounds.   Pulmonary/Chest: Effort normal and breath sounds normal. No respiratory distress. He has no wheezes. He has no rales.  Abdominal: Soft. Bowel sounds are normal. He exhibits no distension. There is no tenderness. There is no rebound.  Musculoskeletal: Normal range of motion. He exhibits no edema.  Swelling noted of the left wrist. Tender over ulnar styloid. Pain with flexion and extension of the wrist  Neurological: He is alert and oriented to person, place, and time. No cranial nerve  deficit. Coordination normal.  Left grip weakness. 4/5 strength of the left bicep, tricep, deltoid, and left quaricep, left foot compared to right 5/5 strength. Normal coordination. No pronator drift.   Skin: Skin is warm and dry.  Psychiatric: He has a normal mood and affect. His behavior is normal.    ED Course  Procedures (including critical care time)  Results for orders placed during the hospital encounter of 11/15/12  CBC      Result Value Range   WBC 11.5 (*) 4.0 - 10.5 K/uL   RBC 4.85  4.22 - 5.81 MIL/uL   Hemoglobin 14.7  13.0 - 17.0 g/dL   HCT 40.9  81.1 - 91.4 %   MCV 87.2  78.0 - 100.0 fL   MCH 30.3  26.0 - 34.0 pg   MCHC 34.8  30.0 - 36.0 g/dL   RDW 78.2  95.6 - 21.3 %   Platelets 371  150 - 400 K/uL  PRO B NATRIURETIC PEPTIDE      Result Value Range   Pro B Natriuretic peptide (BNP) 51.5  0 - 125 pg/mL  COMPREHENSIVE METABOLIC PANEL      Result Value Range   Sodium 140  135 - 145 mEq/L   Potassium 4.0  3.5 - 5.1 mEq/L   Chloride 103  96 - 112 mEq/L   CO2 25  19 - 32 mEq/L   Glucose, Bld 94  70 - 99 mg/dL   BUN 17  6 - 23 mg/dL   Creatinine, Ser 0.86  0.50 - 1.35 mg/dL   Calcium 9.5  8.4 - 57.8 mg/dL   Total Protein 7.2  6.0 - 8.3 g/dL   Albumin 3.5  3.5 - 5.2 g/dL   AST 15  0 - 37 U/L   ALT 17  0 - 53 U/L   Alkaline Phosphatase 104  39 - 117 U/L   Total Bilirubin 0.3  0.3 - 1.2 mg/dL   GFR calc non Af Amer >90  >90 mL/min   GFR calc Af Amer >90  >90 mL/min  POCT I-STAT TROPONIN I      Result Value Range   Troponin i, poc 0.01  0.00 - 0.08 ng/mL   Comment 3            Dg Chest 2 View  11/15/2012  *RADIOLOGY REPORT*  Clinical Data: Shortness of breath and chest pain  CHEST - 2 VIEW  Comparison: None  Findings: The cardiomediastinal silhouette is unremarkable. COPD/emphysema noted. There is no evidence of focal airspace disease, pulmonary edema, suspicious pulmonary nodule/mass, pleural effusion, or pneumothorax.  No acute bony abnormalities are identified.   IMPRESSION: No evidence of acute cardiopulmonary disease.  COPD/emphysema.   Original Report Authenticated By: Harmon Pier, M.D.    Dg Wrist Complete Left  11/15/2012  *RADIOLOGY REPORT*  Clinical Data: Right wrist pain and swelling.  LEFT WRIST - COMPLETE 3+ VIEW  Comparison: None  Findings: There is no evidence of fracture, subluxation or dislocation. Diffuse osteopenia is noted. Mild degenerative changes within the wrist identified. No focal bony lesions are present. Soft tissue swelling is identified.  IMPRESSION: Soft tissue swelling without acute bony abnormality.  Degenerative changes.   Original Report Authenticated By: Harmon Pier, M.D.    Ct Head Wo Contrast  11/15/2012  *RADIOLOGY REPORT*  Clinical Data: Weakness on the left side of the body.  CT HEAD WITHOUT CONTRAST  Technique:  Contiguous axial images were obtained from the base of the skull through the vertex without contrast.  Comparison: Head CT 01/31/2009.  Findings: No acute intracranial abnormalities.  Specifically, no evidence of acute intracranial hemorrhage, no definite findings of acute/subacute cerebral ischemia, no mass, mass effect, hydrocephalus or abnormal intra or extra-axial fluid collections. Visualized paranasal sinuses and mastoids are well pneumatized.  No acute displaced skull fractures are identified.  IMPRESSION: 1.  No acute intracranial abnormalities. 2.  Appearance of the brain is normal.   Original Report Authenticated By: Trudie Reed, M.D.     Date: 11/15/2012  Rate: 95  Rhythm: normal sinus rhythm  QRS Axis: normal  Intervals: normal  ST/T Wave abnormalities: normal  Conduction Disutrbances:none  Narrative Interpretation:   Old EKG Reviewed: unchanged    3:40 PM Pt continues to have pain in left wrist as well as weakness in left arm and leg. Unsure if related. Will order pain medication for left wrist pain and splint. CT head negative. Will consult neurology.   4:04 PM Spoke with Dr. Cyril Mourning,  neurology, advised MRI brain. If negative, home with pain medications for wrist. If positive admission.   Filed Vitals:   11/15/12 1323 11/15/12 1345 11/15/12 1415 11/15/12 1547  BP: 157/75 173/83 156/86   Pulse: 94 76 80   Temp: 97.8 F (36.6 C)   97.9 F (36.6 C)  TempSrc: Oral     Resp: 18 17 16    SpO2: 95% 97% 97%      1. Numbness       MDM         Lottie Mussel, PA-C 11/18/12 1630

## 2012-11-15 NOTE — ED Notes (Signed)
Pt. Stated, i was sent over here by Dr. Leata Mouse at an UC.  I'm mostly concerned about my lt. Wrist that's given me trouble and feeling all the way up my arm.  I've also had SOB and some unusual tiredness more than average.

## 2012-11-15 NOTE — ED Notes (Addendum)
Pt seen by PCP this AM for wrist pain that started approx 1 week ago. 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 Pt states worsening symptoms yesterday. Endorses numbness and tingling in bilat upper extremities. Endorses "chest tightness" Pt denies headache, nausea, vomiting, vision changes.

## 2012-11-15 NOTE — ED Notes (Signed)
PA at bedside.

## 2012-11-15 NOTE — Progress Notes (Signed)
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  He thinks he may also have LLE weakness.  The symptoms have been improving slowly. Patient has had bypass surgery and takes an aspirin daily, along with hypertension Risk factors for stroke include smoking, CAD, hypertension, hyperlipidemia  Patient has been weak all week and drove into town from work in [place] last night.  Patient notes hip pain bilaterally, worse left than right, when walking a couple blocks.  F/Hx:  Both parents deceased.  Dad died of MI at 62, all three brothers died of heart disease.  Sig Negs:  No chest pain, shortness of breath, or abdominal pain  After discussing the symptoms with patient, he confided in me that he had a bright red bloody bowel movement yesterday but has not had any syncope or lightheadedness. As a first episode of blood per rectum. He has no history of colonoscopy.  Objective:  Alert, NAD Speech appears normal Eyes:  EOMI, perrla, Fundi:  Silver wiring with mild optic atrophy Neck: ?faint bruit on right, no thyromegaly, supple Chest:  Distant breath sounds, faint wheezes Heart:  Distant heart sounds, I/VI systolic murmur Abdomen:  Soft, no HSM Ext: no edema Neuro:  Normal mental status CN III-XII intact Motor: weak left grasp, triceps and biceps  Able to stand on either leg individually, but feels much stronger in right leg.  Left hamstring pull against resistance is less. slight dysmetria with finger to nose, missing inferiorly.  No tremor. Sensory: burning dysesthesia ulnar left arm  Bilateral femoral bruits, good dorsalis pedal pulses  Results for orders placed in visit on 11/15/12  POCT CBC      Result Value Range   WBC 8.9  4.6 - 10.2 K/uL   Lymph, poc 2.2  0.6 - 3.4   POC LYMPH PERCENT 24.4  10 - 50 %L   MID (cbc) 0.5  0 - 0.9   POC MID % 5.9  0 - 12 %M   POC Granulocyte 6.2  2 - 6.9   Granulocyte percent 69.7  37 - 80 %G   RBC 4.49 (*) 4.69 - 6.13 M/uL   Hemoglobin 12.8 (*) 14.1 - 18.1 g/dL   HCT, POC 16.1 (*) 09.6 - 53.7 %   MCV 93.7  80 - 97 fL   MCH, POC 28.5  27 - 31.2 pg   MCHC 30.4 (*) 31.8 - 35.4 g/dL   RDW, POC 04.5     Platelet Count, POC 387  142 - 424 K/uL   MPV 8.1  0 - 99.8 fL   EKG:  Normal sinus rhythm  Assessment:  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  H/o leukocytosis which has resolved.  Despite urging and explaining the risks, patient refuses to take an ambulance because of monetary issues. The last time he took an ambulance because of $600. Nevertheless he does agree to go to the emergency room department at [facility]. We've arranged taxi for him and since he seems stable at this time with modest improvement since yesterday, delicate safe to transport in this way.  Plan:  Evaluation at [facility] emergency department for presumed left upper extremity weakness secondary to stroke and recent episode of bright red blood per rectum with mild anemia.  Sign, [name]

## 2012-11-15 NOTE — Progress Notes (Signed)
Orthopedic Tech Progress Note Patient Details:  [name] Mar 29, 1951 [id number]  Ortho Devices Type of Ortho Device: Velcro wrist splint Ortho Device/Splint Location: left wrist Ortho Device/Splint Interventions: Application   Nikki Dom 11/15/2012, 3:53 PM

## 2012-11-16 ENCOUNTER — Ambulatory Visit (INDEPENDENT_AMBULATORY_CARE_PROVIDER_SITE_OTHER): Payer: BC Managed Care – PPO | Admitting: Emergency Medicine

## 2012-11-16 VITALS — BP 182/98 | HR 99 | Temp 98.3°F | Resp 17 | Ht 67.0 in | Wt 198.4 lb

## 2012-11-16 DIAGNOSIS — M25532 Pain in left wrist: Secondary | ICD-10-CM

## 2012-11-16 DIAGNOSIS — M109 Gout, unspecified: Secondary | ICD-10-CM

## 2012-11-16 DIAGNOSIS — I10 Essential (primary) hypertension: Secondary | ICD-10-CM

## 2012-11-16 DIAGNOSIS — M25539 Pain in unspecified wrist: Secondary | ICD-10-CM

## 2012-11-16 MED ORDER — COLCHICINE 0.6 MG PO TABS: ORAL_TABLET | ORAL | Status: DC

## 2012-11-16 MED ORDER — INDOMETHACIN 25 MG PO CAPS: 25.0000 mg | ORAL_CAPSULE | Freq: Three times a day (TID) | ORAL | Status: DC

## 2012-11-16 MED ORDER — AMLODIPINE BESYLATE 5 MG PO TABS: 5.0000 mg | ORAL_TABLET | Freq: Every day | ORAL | Status: DC

## 2012-11-16 NOTE — Progress Notes (Signed)
[facility] [address] [place] [address] [phone]  Date:  11/16/2012   Name:  [name]   DOB:  [birth year]   MRN:  [id number]  PCP:  No primary provider on file.    Chief Complaint: F/U Lt wrist, HBP   History of Present Illness:  [name] is a 62 y.o. very pleasant male patient who presents with the following:  Symptoms from yesterday have resolved.  Blood pressure elevation remains an issue.  His wrist is painful and interfered with sleep last night.  No history of gout.  His father had gout.  No history of injury.  Could have over use injury but has no recollection.  No improvement with over the counter medications or other home remedies. Continues to wear splint.   Denies other complaint or health concern today.   Patient Active Problem List  Diagnosis  . Chest pain  . Claudication  . HTN (hypertension)  . HLD (hyperlipidemia)  . Tobacco abuse  . CAD (coronary artery disease)  . Leukocytosis    Past Medical History  Diagnosis Date  . CAD (coronary artery disease)     LHC 1/08: mLAD 30-50%, mCFX 30%, left PDA 30%,  EF 55%;    Myoview  07/31/11: EF 54%, no scar or ischemia, + chest pain, + hypotensive response  . Hypertension   . Hyperlipidemia   . Chest pain   . Leukocytosis     Past Surgical History  Procedure Laterality Date  . Appendectomy    . Hemorrhoid surgery      History  Substance Use Topics  . Smoking status: Current Every Day Smoker  . Smokeless tobacco: Never Used  . Alcohol Use: No    Family History  Problem Relation Age of Onset  . Coronary artery disease      Father with MI in his 7s  . Heart attack Father   . Cancer Brother     lung  . Heart attack Paternal Grandfather   . Stroke Brother   . Coronary artery disease Brother   . Heart attack Brother     No Known Allergies  Medication list has been reviewed and updated.  Current Outpatient Prescriptions on File Prior to Visit   Medication Sig Dispense Refill  . aspirin EC 81 MG tablet Take 81 mg by mouth daily.      Marland Kitchen losartan (COZAAR) 100 MG tablet Take 1 tablet (100 mg total) by mouth daily.  30 tablet  11  . metoprolol succinate (TOPROL XL) 100 MG 24 hr tablet Take 1 tablet (100 mg total) by mouth daily. Take with or immediately following a meal.  30 tablet  11  . nitroGLYCERIN (NITROSTAT) 0.4 MG SL tablet Place 0.4 mg under the tongue every 5 (five) minutes as needed for chest pain.      . rosuvastatin (CRESTOR) 10 MG tablet Take 10 mg by mouth daily.       No current facility-administered medications on file prior to visit.    Review of Systems:  As per HPI, otherwise negative.    Physical Examination: Filed Vitals:   11/16/12 0907  BP: 182/98  Pulse: 99  Temp: 98.3 F (36.8 C)  Resp: 17   Filed Vitals:   11/16/12 0907  Height: 5\' 7"  (1.702 m)  Weight: 198 lb 6.4 oz (89.994 kg)   Body mass index is 31.07 kg/(m^2). Ideal Body Weight: Weight in (lb) to have BMI = 25: 159.3  GEN: WDWN, NAD, Non-toxic, A & O x 3 HEENT: Atraumatic, Normocephalic. Neck supple. No masses, No LAD. Ears and Nose: No external deformity. CV: RRR, No M/G/R. No JVD. No thrill. No extra heart sounds. PULM: CTA B, no wheezes, crackles, rhonchi. No retractions. No resp. distress. No accessory muscle use. ABD: S, NT, ND, +BS. No rebound. No HSM. EXTR: No c/c/e  Tender swollen left wrist dorsally.  No erythema or cellulitis.  NATI. NEURO Normal gait.  PSYCH: Normally interactive. Conversant. Not depressed or anxious appearing.  Calm demeanor.    Assessment and Plan: Wrist pain ?? Gout  Labs pending Hypertension.  Add Norvasc Follow up with cardiologist this week if possible otherwise here for blood pressure.  Signed,  Phillips Odor, MD

## 2012-11-16 NOTE — Patient Instructions (Signed)
Hypertension As your heart beats, it forces blood through your arteries. This force is your blood pressure. If the pressure is too high, it is called hypertension (HTN) or high blood pressure. HTN is dangerous because you may have it and not know it. High blood pressure may mean that your heart has to work harder to pump blood. Your arteries may be narrow or stiff. The extra work puts you at risk for heart disease, stroke, and other problems.  Blood pressure consists of two numbers, a higher number over a lower, 110/72, for example. It is stated as "110 over 72." The ideal is below 120 for the top number (systolic) and under 80 for the bottom (diastolic). Write down your blood pressure today. You should pay close attention to your blood pressure if you have certain conditions such as:  Heart failure.  Prior heart attack.  Diabetes  Chronic kidney disease.  Prior stroke.  Multiple risk factors for heart disease. To see if you have HTN, your blood pressure should be measured while you are seated with your arm held at the level of the heart. It should be measured at least twice. A one-time elevated blood pressure reading (especially in the Emergency Department) does not mean that you need treatment. There may be conditions in which the blood pressure is different between your right and left arms. It is important to see your caregiver soon for a recheck. Most people have essential hypertension which means that there is not a specific cause. This type of high blood pressure may be lowered by changing lifestyle factors such as:  Stress.  Smoking.  Lack of exercise.  Excessive weight.  Drug/tobacco/alcohol use.  Eating less salt. Most people do not have symptoms from high blood pressure until it has caused damage to the body. Effective treatment can often prevent, delay or reduce that damage. TREATMENT  When a cause has been identified, treatment for high blood pressure is directed at the  cause. There are a large number of medications to treat HTN. These fall into several categories, and your caregiver will help you select the medicines that are best for you. Medications may have side effects. You should review side effects with your caregiver. If your blood pressure stays high after you have made lifestyle changes or started on medicines,   Your medication(s) may need to be changed.  Other problems may need to be addressed.  Be certain you understand your prescriptions, and know how and when to take your medicine.  Be sure to follow up with your caregiver within the time frame advised (usually within two weeks) to have your blood pressure rechecked and to review your medications.  If you are taking more than one medicine to lower your blood pressure, make sure you know how and at what times they should be taken. Taking two medicines at the same time can result in blood pressure that is too low. SEEK IMMEDIATE MEDICAL CARE IF:  You develop a severe headache, blurred or changing vision, or confusion.  You have unusual weakness or numbness, or a faint feeling.  You have severe chest or abdominal pain, vomiting, or breathing problems. MAKE SURE YOU:   Understand these instructions.  Will watch your condition.  Will get help right away if you are not doing well or get worse. Document Released: 07/08/2005 Document Revised: 09/30/2011 Document Reviewed: 02/26/2008 [facility] Patient Information 2013 [place] [employer], [place]. Gout Gout is an inflammatory condition (arthritis) caused by a buildup of uric acid crystals in  the joints. Uric acid is a chemical that is normally present in the blood. Under some circumstances, uric acid can form into crystals in your joints. This causes joint redness, soreness, and swelling (inflammation). Repeat attacks are common. Over time, uric acid crystals can form into masses (tophi) near a joint, causing disfigurement. Gout is treatable and often  preventable. CAUSES  The disease begins with elevated levels of uric acid in the blood. Uric acid is produced by your body when it breaks down a naturally found substance called purines. This also happens when you eat certain foods such as meats and fish. Causes of an elevated uric acid level include:  Being passed down from parent to child (heredity).  Diseases that cause increased uric acid production (obesity, psoriasis, some cancers).  Excessive alcohol use.  Diet, especially diets rich in meat and seafood.  Medicines, including certain cancer-fighting drugs (chemotherapy), diuretics, and aspirin.  Chronic kidney disease. The kidneys are no longer able to remove uric acid well.  Problems with metabolism. Conditions strongly associated with gout include:  Obesity.  High blood pressure.  High cholesterol.  Diabetes. Not everyone with elevated uric acid levels gets gout. It is not understood why some people get gout and others do not. Surgery, joint injury, and eating too much of certain foods are some of the factors that can lead to gout. SYMPTOMS   An attack of gout comes on quickly. It causes intense pain with redness, swelling, and warmth in a joint.  Fever can occur.  Often, only one joint is involved. Certain joints are more commonly involved:  Base of the big toe.  Knee.  Ankle.  Wrist.  Finger. Without treatment, an attack usually goes away in a few days to weeks. Between attacks, you usually will not have symptoms, which is different from many other forms of arthritis. DIAGNOSIS  Your caregiver will suspect gout based on your symptoms and exam. Removal of fluid from the joint (arthrocentesis) is done to check for uric acid crystals. Your caregiver will give you a medicine that numbs the area (local anesthetic) and use a needle to remove joint fluid for exam. Gout is confirmed when uric acid crystals are seen in joint fluid, using a special microscope.  Sometimes, blood, urine, and X-ray tests are also used. TREATMENT  There are 2 phases to gout treatment: treating the sudden onset (acute) attack and preventing attacks (prophylaxis). Treatment of an Acute Attack  Medicines are used. These include anti-inflammatory medicines or steroid medicines.  An injection of steroid medicine into the affected joint is sometimes necessary.  The painful joint is rested. Movement can worsen the arthritis.  You may use warm or cold treatments on painful joints, depending which works best for you.  Discuss the use of coffee, vitamin C, or cherries with your caregiver. These may be helpful treatment options. Treatment to Prevent Attacks After the acute attack subsides, your caregiver may advise prophylactic medicine. These medicines either help your kidneys eliminate uric acid from your body or decrease your uric acid production. You may need to stay on these medicines for a very long time. The early phase of treatment with prophylactic medicine can be associated with an increase in acute gout attacks. For this reason, during the first few months of treatment, your caregiver may also advise you to take medicines usually used for acute gout treatment. Be sure you understand your caregiver's directions. You should also discuss dietary treatment with your caregiver. Certain foods such as meats and  fish can increase uric acid levels. Other foods such as dairy can decrease levels. Your caregiver can give you a list of foods to avoid. HOME CARE INSTRUCTIONS   Do not take aspirin to relieve pain. This raises uric acid levels.  Only take over-the-counter or prescription medicines for pain, discomfort, or fever as directed by your caregiver.  Rest the joint as much as possible. When in bed, keep sheets and blankets off painful areas.  Keep the affected joint raised (elevated).  Use crutches if the painful joint is in your leg.  Drink enough water and fluids to  keep your urine clear or pale yellow. This helps your body get rid of uric acid. Do not drink alcoholic beverages. They slow the passage of uric acid.  Follow your caregiver's dietary instructions. Pay careful attention to the amount of protein you eat. Your daily diet should emphasize fruits, vegetables, whole grains, and fat-free or low-fat milk products.  Maintain a healthy body weight. SEEK MEDICAL CARE IF:   You have an oral temperature above 102 F (38.9 C).  You develop diarrhea, vomiting, or any side effects from medicines.  You do not feel better in 24 hours, or you are getting worse. SEEK IMMEDIATE MEDICAL CARE IF:   Your joint becomes suddenly more tender and you have:  Chills.  An oral temperature above 102 F (38.9 C), not controlled by medicine. MAKE SURE YOU:   Understand these instructions.  Will watch your condition.  Will get help right away if you are not doing well or get worse. Document Released: 07/05/2000 Document Revised: 09/30/2011 Document Reviewed: 10/16/2009 [facility] Patient Information 2013 [place], [place].

## 2012-11-18 NOTE — ED Provider Notes (Signed)
Medical screening examination/treatment/procedure(s) were performed by non-physician practitioner and as supervising physician I was immediately available for consultation/collaboration.  Corneluis Allston, MD 11/18/12 2148 

## 2012-11-22 ENCOUNTER — Ambulatory Visit (INDEPENDENT_AMBULATORY_CARE_PROVIDER_SITE_OTHER): Payer: BC Managed Care – PPO | Admitting: Emergency Medicine

## 2012-11-22 VITALS — BP 154/84 | HR 65 | Temp 97.8°F | Resp 16 | Ht 68.5 in | Wt 197.0 lb

## 2012-11-22 DIAGNOSIS — M25532 Pain in left wrist: Secondary | ICD-10-CM

## 2012-11-22 DIAGNOSIS — M25539 Pain in unspecified wrist: Secondary | ICD-10-CM

## 2012-11-22 MED ORDER — PREDNISONE 10 MG PO TABS: ORAL_TABLET | ORAL | Status: DC

## 2012-11-22 NOTE — Progress Notes (Signed)
  Subjective:    Patient ID: [name], male    DOB: 12-27-1950, 62 y.o.   MRN: [id number]  HPI  62 year old caucasian male returns for follow up with left wrist pain.  Pt feels pain is now radiating up into his left shoulder. Pt is taking Colchicine & Indocin as prescribed.  Pt has no history of gout flare-ups.   Pt is landscaper / lawn maintenance.  Needs note for work for no use of Left arm and hand for 1 week.   Pt sees Dr. Excell Seltzer for heart related care.   Pt advised to increase fluids, hold both Colchicine and Indocin - and he will be prescribed a cortisone prescription.     Review of Systems     Objective:   Physical Exam there swelling and tenderness over the distal left forearm over the wrist. Most of the tenderness is over the volar area especially the radial side. He does have pain with any flexion extension of the wrist. He is able to flex and extend all of the fingers. Neurovascular he is intact. Uric acid checked at the hospital was 7.2.        Assessment & Plan:  I think history and exam are most consistent with gout. We'll put him on prednisone but we'll use a lower dose at 40 a day for 3 days 30 a day for 3 days 20 a day for 3 days and 10 a day for 3 days because of his blood pressure elevation. He was advised of his blood pressure problem and to contact his cardiologist regarding medication adjustments.

## 2012-11-22 NOTE — Patient Instructions (Signed)
Gout Gout is an inflammatory condition (arthritis) caused by a buildup of uric acid crystals in the joints. Uric acid is a chemical that is normally present in the blood. Under some circumstances, uric acid can form into crystals in your joints. This causes joint redness, soreness, and swelling (inflammation). Repeat attacks are common. Over time, uric acid crystals can form into masses (tophi) near a joint, causing disfigurement. Gout is treatable and often preventable. CAUSES  The disease begins with elevated levels of uric acid in the blood. Uric acid is produced by your body when it breaks down a naturally found substance called purines. This also happens when you eat certain foods such as meats and fish. Causes of an elevated uric acid level include:  Being passed down from parent to child (heredity).  Diseases that cause increased uric acid production (obesity, psoriasis, some cancers).  Excessive alcohol use.  Diet, especially diets rich in meat and seafood.  Medicines, including certain cancer-fighting drugs (chemotherapy), diuretics, and aspirin.  Chronic kidney disease. The kidneys are no longer able to remove uric acid well.  Problems with metabolism. Conditions strongly associated with gout include:  Obesity.  High blood pressure.  High cholesterol.  Diabetes. Not everyone with elevated uric acid levels gets gout. It is not understood why some people get gout and others do not. Surgery, joint injury, and eating too much of certain foods are some of the factors that can lead to gout. SYMPTOMS   An attack of gout comes on quickly. It causes intense pain with redness, swelling, and warmth in a joint.  Fever can occur.  Often, only one joint is involved. Certain joints are more commonly involved:  Base of the big toe.  Knee.  Ankle.  Wrist.  Finger. Without treatment, an attack usually goes away in a few days to weeks. Between attacks, you usually will not have  symptoms, which is different from many other forms of arthritis. DIAGNOSIS  Your caregiver will suspect gout based on your symptoms and exam. Removal of fluid from the joint (arthrocentesis) is done to check for uric acid crystals. Your caregiver will give you a medicine that numbs the area (local anesthetic) and use a needle to remove joint fluid for exam. Gout is confirmed when uric acid crystals are seen in joint fluid, using a special microscope. Sometimes, blood, urine, and X-ray tests are also used. TREATMENT  There are 2 phases to gout treatment: treating the sudden onset (acute) attack and preventing attacks (prophylaxis). Treatment of an Acute Attack  Medicines are used. These include anti-inflammatory medicines or steroid medicines.  An injection of steroid medicine into the affected joint is sometimes necessary.  The painful joint is rested. Movement can worsen the arthritis.  You may use warm or cold treatments on painful joints, depending which works best for you.  Discuss the use of coffee, vitamin C, or cherries with your caregiver. These may be helpful treatment options. Treatment to Prevent Attacks After the acute attack subsides, your caregiver may advise prophylactic medicine. These medicines either help your kidneys eliminate uric acid from your body or decrease your uric acid production. You may need to stay on these medicines for a very long time. The early phase of treatment with prophylactic medicine can be associated with an increase in acute gout attacks. For this reason, during the first few months of treatment, your caregiver may also advise you to take medicines usually used for acute gout treatment. Be sure you understand your caregiver's directions.   You should also discuss dietary treatment with your caregiver. Certain foods such as meats and fish can increase uric acid levels. Other foods such as dairy can decrease levels. Your caregiver can give you a list of foods  to avoid. HOME CARE INSTRUCTIONS   Do not take aspirin to relieve pain. This raises uric acid levels.  Only take over-the-counter or prescription medicines for pain, discomfort, or fever as directed by your caregiver.  Rest the joint as much as possible. When in bed, keep sheets and blankets off painful areas.  Keep the affected joint raised (elevated).  Use crutches if the painful joint is in your leg.  Drink enough water and fluids to keep your urine clear or pale yellow. This helps your body get rid of uric acid. Do not drink alcoholic beverages. They slow the passage of uric acid.  Follow your caregiver's dietary instructions. Pay careful attention to the amount of protein you eat. Your daily diet should emphasize fruits, vegetables, whole grains, and fat-free or low-fat milk products.  Maintain a healthy body weight. SEEK MEDICAL CARE IF:   You have an oral temperature above 102 F (38.9 C).  You develop diarrhea, vomiting, or any side effects from medicines.  You do not feel better in 24 hours, or you are getting worse. SEEK IMMEDIATE MEDICAL CARE IF:   Your joint becomes suddenly more tender and you have:  Chills.  An oral temperature above 102 F (38.9 C), not controlled by medicine. MAKE SURE YOU:   Understand these instructions.  Will watch your condition.  Will get help right away if you are not doing well or get worse. Document Released: 07/05/2000 Document Revised: 09/30/2011 Document Reviewed: 10/16/2009 [employer] Patient Information 2013 [employer].    

## 2012-12-04 ENCOUNTER — Other Ambulatory Visit: Payer: BC Managed Care – PPO

## 2013-01-17 ENCOUNTER — Ambulatory Visit (INDEPENDENT_AMBULATORY_CARE_PROVIDER_SITE_OTHER): Payer: BC Managed Care – PPO | Admitting: Emergency Medicine

## 2013-01-17 ENCOUNTER — Ambulatory Visit: Payer: BC Managed Care – PPO

## 2013-01-17 VITALS — BP 142/87 | HR 79 | Temp 98.0°F | Resp 18 | Ht 67.25 in | Wt 197.0 lb

## 2013-01-17 DIAGNOSIS — M25559 Pain in unspecified hip: Secondary | ICD-10-CM

## 2013-01-17 DIAGNOSIS — M25552 Pain in left hip: Secondary | ICD-10-CM

## 2013-01-17 MED ORDER — NAPROXEN SODIUM 550 MG PO TABS: 550.0000 mg | ORAL_TABLET | Freq: Two times a day (BID) | ORAL | Status: DC

## 2013-01-17 NOTE — Progress Notes (Signed)
[facility] [address] [place] [address] [phone]  Date:  01/17/2013   Name:  [name]   DOB:  03-26-1951   MRN:  [id number]  PCP:  [name], MD    Chief Complaint: Hip Pain and Spasms   History of Present Illness:  [name] is a 62 y.o. very pleasant male patient who presents with the following:  Has pain in left hip in to left mid calf laterally since Thursday.  Says worse when he bears weight.  No history of injury or overuse.  No numbness, tingling or weakness.  No history of sciatic neuritis.  No relief with tylenol.  No improvement with over the counter medications or other home remedies. Denies other complaint or health concern today.   Patient Active Problem List   Diagnosis Date Noted  . Leukocytosis   . Claudication 08/07/2011  . HTN (hypertension) 08/07/2011  . HLD (hyperlipidemia) 08/07/2011  . Tobacco abuse 08/07/2011  . CAD (coronary artery disease) 08/07/2011  . Chest pain 07/12/2011    Past Medical History  Diagnosis Date  . CAD (coronary artery disease)     LHC 1/08: mLAD 30-50%, mCFX 30%, left PDA 30%,  EF 55%;    Myoview  07/31/11: EF 54%, no scar or ischemia, + chest pain, + hypotensive response  . Hypertension   . Hyperlipidemia   . Chest pain   . Leukocytosis     Past Surgical History  Procedure Laterality Date  . Appendectomy    . Hemorrhoid surgery      History  Substance Use Topics  . Smoking status: Current Every Day Smoker  . Smokeless tobacco: Never Used  . Alcohol Use: No    Family History  Problem Relation Age of Onset  . Coronary artery disease      Father with MI in his 18s  . Heart attack Father   . Cancer Brother     lung  . Heart attack Paternal Grandfather   . Stroke Brother   . Coronary artery disease Brother   . Heart attack Brother     No Known Allergies  Medication list has been reviewed and updated.  Current Outpatient Prescriptions on File Prior to Visit   Medication Sig Dispense Refill  . amLODipine (NORVASC) 5 MG tablet Take 1 tablet (5 mg total) by mouth daily.  30 tablet  3  . aspirin EC 81 MG tablet Take 81 mg by mouth daily.      . colchicine 0.6 MG tablet 2 now and one in one hour.  Tomorrow and beyond one daily  15 tablet  0  . indomethacin (INDOCIN) 25 MG capsule Take 1 capsule (25 mg total) by mouth 3 (three) times daily with meals.  30 capsule  0  . losartan (COZAAR) 100 MG tablet Take 1 tablet (100 mg total) by mouth daily.  30 tablet  11  . metoprolol succinate (TOPROL XL) 100 MG 24 hr tablet Take 1 tablet (100 mg total) by mouth daily. Take with or immediately following a meal.  30 tablet  11  . nitroGLYCERIN (NITROSTAT) 0.4 MG SL tablet Place 0.4 mg under the tongue every 5 (five) minutes as needed for chest pain.      . predniSONE (DELTASONE) 10 MG tablet Take 4 a day for 3 days 3 a day for 3 days 2 a day for 3 days one day for 3 days  30 tablet  0  . rosuvastatin (CRESTOR)  10 MG tablet Take 10 mg by mouth daily.       No current facility-administered medications on file prior to visit.    Review of Systems:  As per HPI, otherwise negative.    Physical Examination: Filed Vitals:   01/17/13 1110  BP: 142/87  Pulse: 79  Temp: 98 F (36.7 C)  Resp: 18   Filed Vitals:   01/17/13 1110  Height: 5' 7.25" (1.708 m)  Weight: 197 lb (89.359 kg)   Body mass index is 30.63 kg/(m^2). Ideal Body Weight: Weight in (lb) to have BMI = 25: 160.5   GEN: WDWN, NAD, Non-toxic, Alert & Oriented x 3 HEENT: Atraumatic, Normocephalic.  Ears and Nose: No external deformity. EXTR: No clubbing/cyanosis/edema NEURO: Normal gait.  PSYCH: Normally interactive. Conversant. Not depressed or anxious appearing.  Calm demeanor.  LEFT hip:  Full AROM.  Tender lateral hip.  No tenderness in back or sciatic notch  Assessment and Plan: Hip pain  Anaprox Follow up in one week Rule out sciatic neuritis   Signed,  Phillips Odor,  MD   [facility] reading (PRIMARY) by  Dr. Dareen Piano.  negative.

## 2013-03-05 ENCOUNTER — Ambulatory Visit (INDEPENDENT_AMBULATORY_CARE_PROVIDER_SITE_OTHER): Payer: BC Managed Care – PPO | Admitting: Internal Medicine

## 2013-03-05 VITALS — BP 128/82 | HR 81 | Temp 98.8°F | Resp 18 | Ht 67.0 in | Wt 196.0 lb

## 2013-03-05 DIAGNOSIS — M109 Gout, unspecified: Secondary | ICD-10-CM

## 2013-03-05 DIAGNOSIS — Z8269 Family history of other diseases of the musculoskeletal system and connective tissue: Secondary | ICD-10-CM

## 2013-03-05 DIAGNOSIS — Z8349 Family history of other endocrine, nutritional and metabolic diseases: Secondary | ICD-10-CM

## 2013-03-05 DIAGNOSIS — M79609 Pain in unspecified limb: Secondary | ICD-10-CM

## 2013-03-05 MED ORDER — PREDNISONE 10 MG PO TABS: ORAL_TABLET | ORAL | Status: DC

## 2013-03-05 MED ORDER — HYDROCODONE-ACETAMINOPHEN 5-325 MG PO TABS: 1.0000 | ORAL_TABLET | Freq: Four times a day (QID) | ORAL | Status: DC | PRN

## 2013-03-05 NOTE — Progress Notes (Signed)
  Subjective:    Patient ID: [name], male    DOB: 1950/11/04, 62 y.o.   MRN: [id number]  HPI Having another gout attack, this time in left hand base of 3rd finger. No trauma, gout confirmed at orthopedic by hx . Last uric acid 7.2 high normal. He wants preventive tx.   Review of Systems     Objective:   Physical Exam  Vitals reviewed. Constitutional: He appears well-developed and well-nourished.  HENT:  Right Ear: External ear normal.  Left Ear: External ear normal.  Eyes: EOM are normal. Pupils are equal, round, and reactive to light.  Neck: Neck supple.  Cardiovascular: Normal rate, regular rhythm and normal heart sounds.   Musculoskeletal: He exhibits edema and tenderness.       Left hand: He exhibits decreased range of motion, tenderness, bony tenderness and swelling. He exhibits normal two-point discrimination and no deformity.       Hands: gout   Results for orders placed in visit on 11/16/12  URIC ACID      Result Value Range   Uric Acid, Serum 7.2  4.0 - 7.8 mg/dL          Assessment & Plan:  Probable gout Cmet and uric acid/consider allopurinol or rheumatology referral Pred taper/Vicodin

## 2013-03-05 NOTE — Patient Instructions (Signed)
Gout  Gout is an inflammatory condition (arthritis) caused by a buildup of uric acid crystals in the joints. Uric acid is a chemical that is normally present in the blood. Under some circumstances, uric acid can form into crystals in your joints. This causes joint redness, soreness, and swelling (inflammation). Repeat attacks are common. Over time, uric acid crystals can form into masses (tophi) near a joint, causing disfigurement. Gout is treatable and often preventable.  CAUSES   The disease begins with elevated levels of uric acid in the blood. Uric acid is produced by your body when it breaks down a naturally found substance called purines. This also happens when you eat certain foods such as meats and fish. Causes of an elevated uric acid level include:   Being passed down from parent to child (heredity).   Diseases that cause increased uric acid production (obesity, psoriasis, some cancers).   Excessive alcohol use.   Diet, especially diets rich in meat and seafood.   Medicines, including certain cancer-fighting drugs (chemotherapy), diuretics, and aspirin.   Chronic kidney disease. The kidneys are no longer able to remove uric acid well.   Problems with metabolism.  Conditions strongly associated with gout include:   Obesity.   High blood pressure.   High cholesterol.   Diabetes.  Not everyone with elevated uric acid levels gets gout. It is not understood why some people get gout and others do not. Surgery, joint injury, and eating too much of certain foods are some of the factors that can lead to gout.  SYMPTOMS    An attack of gout comes on quickly. It causes intense pain with redness, swelling, and warmth in a joint.   Fever can occur.   Often, only one joint is involved. Certain joints are more commonly involved:   Base of the big toe.   Knee.   Ankle.   Wrist.   Finger.  Without treatment, an attack usually goes away in a few days to weeks. Between attacks, you usually will not have  symptoms, which is different from many other forms of arthritis.  DIAGNOSIS   Your caregiver will suspect gout based on your symptoms and exam. Removal of fluid from the joint (arthrocentesis) is done to check for uric acid crystals. Your caregiver will give you a medicine that numbs the area (local anesthetic) and use a needle to remove joint fluid for exam. Gout is confirmed when uric acid crystals are seen in joint fluid, using a special microscope. Sometimes, blood, urine, and X-ray tests are also used.  TREATMENT   There are 2 phases to gout treatment: treating the sudden onset (acute) attack and preventing attacks (prophylaxis).  Treatment of an Acute Attack   Medicines are used. These include anti-inflammatory medicines or steroid medicines.   An injection of steroid medicine into the affected joint is sometimes necessary.   The painful joint is rested. Movement can worsen the arthritis.   You may use warm or cold treatments on painful joints, depending which works best for you.   Discuss the use of coffee, vitamin C, or cherries with your caregiver. These may be helpful treatment options.  Treatment to Prevent Attacks  After the acute attack subsides, your caregiver may advise prophylactic medicine. These medicines either help your kidneys eliminate uric acid from your body or decrease your uric acid production. You may need to stay on these medicines for a very long time.  The early phase of treatment with prophylactic medicine can be associated   with an increase in acute gout attacks. For this reason, during the first few months of treatment, your caregiver may also advise you to take medicines usually used for acute gout treatment. Be sure you understand your caregiver's directions.  You should also discuss dietary treatment with your caregiver. Certain foods such as meats and fish can increase uric acid levels. Other foods such as dairy can decrease levels. Your caregiver can give you a list of foods  to avoid.  HOME CARE INSTRUCTIONS    Do not take aspirin to relieve pain. This raises uric acid levels.   Only take over-the-counter or prescription medicines for pain, discomfort, or fever as directed by your caregiver.   Rest the joint as much as possible. When in bed, keep sheets and blankets off painful areas.   Keep the affected joint raised (elevated).   Use crutches if the painful joint is in your leg.   Drink enough water and fluids to keep your urine clear or pale yellow. This helps your body get rid of uric acid. Do not drink alcoholic beverages. They slow the passage of uric acid.   Follow your caregiver's dietary instructions. Pay careful attention to the amount of protein you eat. Your daily diet should emphasize fruits, vegetables, whole grains, and fat-free or low-fat milk products.   Maintain a healthy body weight.  SEEK MEDICAL CARE IF:    You have an oral temperature above 102 F (38.9 C).   You develop diarrhea, vomiting, or any side effects from medicines.   You do not feel better in 24 hours, or you are getting worse.  SEEK IMMEDIATE MEDICAL CARE IF:    Your joint becomes suddenly more tender and you have:   Chills.   An oral temperature above 102 F (38.9 C), not controlled by medicine.  MAKE SURE YOU:    Understand these instructions.   Will watch your condition.   Will get help right away if you are not doing well or get worse.  Document Released: 07/05/2000 Document Revised: 09/30/2011 Document Reviewed: 10/16/2009  [employer] Patient Information 2014 [employer].

## 2013-03-06 LAB — CBC WITH DIFFERENTIAL/PLATELET
Eosinophils Relative: 4 % (ref 0–5)
HCT: 45.2 % (ref 39.0–52.0)
Hemoglobin: 15.4 g/dL (ref 13.0–17.0)
Lymphocytes Relative: 30 % (ref 12–46)
Lymphs Abs: 3 10*3/uL (ref 0.7–4.0)
MCV: 88.1 fL (ref 78.0–100.0)
Monocytes Absolute: 0.9 10*3/uL (ref 0.1–1.0)
RBC: 5.13 MIL/uL (ref 4.22–5.81)
WBC: 10 10*3/uL (ref 4.0–10.5)

## 2013-03-06 LAB — COMPREHENSIVE METABOLIC PANEL
ALT: 20 U/L (ref 0–53)
BUN: 17 mg/dL (ref 6–23)
CO2: 30 mEq/L (ref 19–32)
Calcium: 9.7 mg/dL (ref 8.4–10.5)
Chloride: 101 mEq/L (ref 96–112)
Creat: 0.91 mg/dL (ref 0.50–1.35)

## 2013-03-06 LAB — URIC ACID: Uric Acid, Serum: 7.9 mg/dL — ABNORMAL HIGH (ref 4.0–7.8)

## 2013-04-05 ENCOUNTER — Telehealth: Payer: Self-pay | Admitting: Hematology and Oncology

## 2013-04-05 NOTE — Telephone Encounter (Signed)
Pt came by and changed lab to Monday and MD on October

## 2013-04-06 ENCOUNTER — Ambulatory Visit: Payer: BC Managed Care – PPO | Admitting: Oncology

## 2013-04-06 ENCOUNTER — Other Ambulatory Visit: Payer: BC Managed Care – PPO | Admitting: Lab

## 2013-04-30 ENCOUNTER — Other Ambulatory Visit: Payer: Self-pay | Admitting: Hematology and Oncology

## 2013-04-30 ENCOUNTER — Telehealth: Payer: Self-pay | Admitting: Hematology and Oncology

## 2013-04-30 NOTE — Telephone Encounter (Signed)
Per NG cx lb 10/13 and f/u 10/14 not needed. S/w pt re cx appts and per pt he would like to keep appts due to some breathing problems he is having. Per NG ok to reschedule/Keep 10/14 f/u but cx 10/13 lb. Pt made aware that lb cx and he may keep 10/14 appt for f/u @ 3:15pm. F/u appt restored.

## 2013-05-03 ENCOUNTER — Other Ambulatory Visit: Payer: BC Managed Care – PPO | Admitting: Lab

## 2013-05-04 ENCOUNTER — Encounter: Payer: Self-pay | Admitting: Hematology and Oncology

## 2013-05-04 ENCOUNTER — Telehealth: Payer: Self-pay | Admitting: Hematology and Oncology

## 2013-05-04 ENCOUNTER — Ambulatory Visit: Payer: Self-pay | Admitting: Hematology and Oncology

## 2013-05-04 ENCOUNTER — Ambulatory Visit (HOSPITAL_BASED_OUTPATIENT_CLINIC_OR_DEPARTMENT_OTHER): Payer: BC Managed Care – PPO | Admitting: Hematology and Oncology

## 2013-05-04 VITALS — BP 165/75 | HR 94 | Temp 98.2°F | Resp 20 | Ht 67.0 in | Wt 199.5 lb

## 2013-05-04 DIAGNOSIS — R0609 Other forms of dyspnea: Secondary | ICD-10-CM

## 2013-05-04 DIAGNOSIS — F17200 Nicotine dependence, unspecified, uncomplicated: Secondary | ICD-10-CM

## 2013-05-04 DIAGNOSIS — Z862 Personal history of diseases of the blood and blood-forming organs and certain disorders involving the immune mechanism: Secondary | ICD-10-CM

## 2013-05-04 DIAGNOSIS — R0600 Dyspnea, unspecified: Secondary | ICD-10-CM | POA: Insufficient documentation

## 2013-05-04 NOTE — Telephone Encounter (Signed)
Per 10/14 pof no return appt needed. Pt given appt for 10/22 @ 4:15pm w/Dr. Sherene Sires.

## 2013-05-04 NOTE — Progress Notes (Signed)
[facility] OFFICE PROGRESS NOTE  Jeremy Sidle, MD DIAGNOSIS:  Leukocytosis  SUMMARY OF HEMATOLOGIC HISTORY: This patient was seen here approximately a year ago for leukocytosis, thought to be related to smoking INTERVAL HISTORY: [name] 62 y.o. male returns for further followup. He recently went to urgent care approximately 2 months ago and his white count is resolved. The patient continued to complain of fatigue and shortness of breath on minimal exertion. He continued to smoke a pack of cigarettes a day unable to quit. Denies any recent cough. No recent chest pain dizziness or lightheadedness.  I have reviewed the past medical history, past surgical history, social history and family history with the patient and they are unchanged from previous note.  ALLERGIES:  has No Known Allergies.  MEDICATIONS:  Current Outpatient Prescriptions  Medication Sig Dispense Refill  . amLODipine (NORVASC) 5 MG tablet Take 1 tablet (5 mg total) by mouth daily.  30 tablet  3  . aspirin EC 81 MG tablet Take 81 mg by mouth daily.      . colchicine 0.6 MG tablet 2 now and one in one hour.  Tomorrow and beyond one daily  15 tablet  0  . HYDROcodone-acetaminophen (NORCO/VICODIN) 5-325 MG per tablet Take 1 tablet by mouth every 6 (six) hours as needed for pain.  30 tablet  0  . indomethacin (INDOCIN) 25 MG capsule Take 1 capsule (25 mg total) by mouth 3 (three) times daily with meals.  30 capsule  0  . losartan (COZAAR) 100 MG tablet Take 1 tablet (100 mg total) by mouth daily.  30 tablet  11  . metoprolol succinate (TOPROL XL) 100 MG 24 hr tablet Take 1 tablet (100 mg total) by mouth daily. Take with or immediately following a meal.  30 tablet  11  . naproxen sodium (ANAPROX DS) 550 MG tablet Take 1 tablet (550 mg total) by mouth 2 (two) times daily with a meal.  40 tablet  0  . nitroGLYCERIN (NITROSTAT) 0.4 MG SL tablet Place 0.4 mg under the tongue every 5 (five) minutes as needed  for chest pain.      . predniSONE (DELTASONE) 10 MG tablet Take 4 a day for 3 days 3 a day for 3 days 2 a day for 3 days one day for 3 days  30 tablet  0  . rosuvastatin (CRESTOR) 10 MG tablet Take 10 mg by mouth daily.       No current facility-administered medications for this visit.     REVIEW OF SYSTEMS:   Constitutional: Denies fevers, chills or night sweats All other systems were reviewed with the patient and are negative.  PHYSICAL EXAMINATION: ECOG PERFORMANCE STATUS: 1 - Symptomatic but completely ambulatory  Filed Vitals:   05/04/13 1548  BP: 165/75  Pulse: 94  Temp: 98.2 F (36.8 C)  Resp: 20   Filed Weights   05/04/13 1548  Weight: 199 lb 8 oz (90.493 kg)    GENERAL:alert, no distress and comfortable Musculoskeletal:no cyanosis of digits and no clubbing  NEURO: alert & oriented x 3 with fluent speech, no focal motor/sensory deficits  LABORATORY DATA:  I have reviewed the data as listed I reviewed his blood work from August which showed normal white blood cell count of 10 ASSESSMENT:  Leukocytosis, resolved  PLAN:  #1 leukocytosis, resolved It is probably related to his history of smoking. We will observe. #2 possible COPD The patient have expiratory wheezes. He has more than 40-pack-year history.  I recommend pulmonology evaluation for possible diagnosis of COPD and he is in agreement #3 smoking I spent some time educating the patient about the importance of nicotine cessation. He is attempting to quit on his own. All questions were answered. The patient knows to call the clinic with any problems, questions or concerns. No barriers to learning was detected.  I spent 15 minutes counseling the patient face to face. The total time spent in the appointment was 20 minutes and more than 50% was on counseling.     [facility], Terrel Nesheiwat, MD 05/04/2013 5:18 PM

## 2013-05-05 ENCOUNTER — Ambulatory Visit: Payer: Self-pay | Admitting: Hematology and Oncology

## 2013-05-05 ENCOUNTER — Other Ambulatory Visit: Payer: BC Managed Care – PPO | Admitting: Lab

## 2013-05-11 ENCOUNTER — Ambulatory Visit (INDEPENDENT_AMBULATORY_CARE_PROVIDER_SITE_OTHER): Payer: BC Managed Care – PPO | Admitting: *Deleted

## 2013-05-11 DIAGNOSIS — Z23 Encounter for immunization: Secondary | ICD-10-CM

## 2013-05-12 ENCOUNTER — Encounter: Payer: Self-pay | Admitting: Internal Medicine

## 2013-05-12 ENCOUNTER — Ambulatory Visit (INDEPENDENT_AMBULATORY_CARE_PROVIDER_SITE_OTHER): Payer: BC Managed Care – PPO | Admitting: Internal Medicine

## 2013-05-12 ENCOUNTER — Ambulatory Visit (INDEPENDENT_AMBULATORY_CARE_PROVIDER_SITE_OTHER)
Admission: RE | Admit: 2013-05-12 | Discharge: 2013-05-12 | Disposition: A | Discharge status: Home / Self Care | Payer: BC Managed Care – PPO | Source: Ambulatory Visit | Attending: Internal Medicine | Admitting: Internal Medicine

## 2013-05-12 VITALS — BP 162/96 | HR 102 | Temp 97.9°F | Ht 67.0 in | Wt 200.2 lb

## 2013-05-12 DIAGNOSIS — F17200 Nicotine dependence, unspecified, uncomplicated: Secondary | ICD-10-CM

## 2013-05-12 DIAGNOSIS — J449 Chronic obstructive pulmonary disease, unspecified: Secondary | ICD-10-CM

## 2013-05-12 DIAGNOSIS — J45909 Unspecified asthma, uncomplicated: Secondary | ICD-10-CM | POA: Insufficient documentation

## 2013-05-12 DIAGNOSIS — R911 Solitary pulmonary nodule: Secondary | ICD-10-CM

## 2013-05-12 MED ORDER — BUDESONIDE-FORMOTEROL FUMARATE 160-4.5 MCG/ACT IN AERO: INHALATION_SPRAY | RESPIRATORY_TRACT | Status: DC

## 2013-05-12 NOTE — Patient Instructions (Addendum)
We may to change your blood pressure pill (metaprolol) to alternative but not yet  symbicort 160 Take 2 puffs first thing in am   Please remember to go to the  x-ray department downstairs for your tests - we will call you with the results when they are available.  The key is to stop smoking completely before smoking completely stops you - it's the most important aspect of your care    Please schedule a follow up office visit in 4 weeks, sooner if needed with pfts Late add see cxr  For spn RUL

## 2013-05-12 NOTE — Progress Notes (Signed)
  Subjective:    Patient ID: [name], male    DOB: 26-Sep-1950    MRN: [id number]  HPI  81 yowm active smoker referred by hematology (with neg eval for leukocytosis)  to pulmonary clinic 05/12/2013 for sob   05/12/2013 1st [name] office visit/ [name] cc doe indolent onset x one year to point where gives out when has to do a lot of walking = a couple of blocks or up hills - no problems sleeping, no real cough.   No obvious day to day or daytime variabilty or assoc   cp or chest tightness, subjective wheeze overt sinus or hb symptoms. No unusual exp hx or h/o childhood pna/ asthma or knowledge of premature birth.  Sleeping ok without nocturnal  or early am exacerbation  of respiratory  c/o's or need for noct saba. Also denies any obvious fluctuation of symptoms with weather or environmental changes or other aggravating or alleviating factors except as outlined above   Current Medications, Allergies, Complete Past Medical History, Past Surgical History, Family History, and Social History were reviewed in [name] record.         Review of Systems  Constitutional: Negative for fever and unexpected weight change.  HENT: Positive for rhinorrhea. Negative for congestion, dental problem, ear pain, nosebleeds, postnasal drip, sneezing, sore throat and trouble swallowing.   Eyes: Negative for redness and itching.  Respiratory: Positive for chest tightness, shortness of breath and wheezing. Negative for cough.   Cardiovascular: Negative for palpitations and leg swelling.  Gastrointestinal: Negative for nausea and vomiting.  Genitourinary: Negative for dysuria.  Musculoskeletal: Negative for joint swelling.  Skin: Negative for rash.  Neurological: Negative for headaches.  Hematological: Does not bruise/bleed easily.  Psychiatric/Behavioral: Negative for dysphoric mood. The patient is not nervous/anxious.        Objective:   Physical Exam  amb wm  nad Wt Readings from Last 3 Encounters:  05/12/13 200 lb 3.2 oz (90.81 kg)  05/04/13 199 lb 8 oz (90.493 kg)  03/05/13 196 lb (88.905 kg)       HEENT mild turbinate edema.  Oropharynx no thrush or excess pnd or cobblestoning.  No JVD or cervical adenopathy. Mild accessory muscle hypertrophy. Trachea midline, nl thryroid. Chest was hyperinflated by percussion with diminished breath sounds and moderate increased exp time without wheeze. Hoover sign positive at mid inspiration. Regular rate and rhythm without murmur gallop or rub or increase P2 or edema.  Abd: no hsm, nl excursion. Ext warm without cyanosis or clubbing.    cxr 05/12/13 No acute infiltrate or pulmonary edema. Mild hyperinflation again noted. Vague nodular density in right upper lobe measures 6.5 mm.       Assessment & Plan:

## 2013-05-13 DIAGNOSIS — F17200 Nicotine dependence, unspecified, uncomplicated: Secondary | ICD-10-CM | POA: Insufficient documentation

## 2013-05-13 DIAGNOSIS — IMO0001 Reserved for inherently not codable concepts without codable children: Secondary | ICD-10-CM | POA: Insufficient documentation

## 2013-05-13 DIAGNOSIS — R911 Solitary pulmonary nodule: Secondary | ICD-10-CM | POA: Insufficient documentation

## 2013-05-13 NOTE — Assessment & Plan Note (Signed)
DDX of  difficult airways managment all start with A and  include Adherence, Ace Inhibitors, Acid Reflux, Active Sinus Disease, Alpha 1 Antitripsin deficiency, Anxiety masquerading as Airways dz,  ABPA,  allergy(esp in young), Aspiration (esp in elderly), Adverse effects of DPI,  Active smokers, plus two Bs  = Bronchiectasis and Beta blocker use..and one C= CHF   Adherence is always the initial "prime suspect" and is a multilayered concern that requires a "trust but verify" approach in every patient - starting with knowing how to use medications, especially inhalers, correctly, keeping up with refills and understanding the fundamental difference between maintenance and prns vs those medications only taken for a very short course and then stopped and not refilled. The proper method of use, as well as anticipated side effects, of a metered-dose inhaler are discussed and demonstrated to the patient. Improved effectiveness after extensive coaching during this visit to a level of approximately  75% so worth trying symbicort 160 2 qam pending pfts to see if daily doe improves  Active smoking greatest issue > see separate a/p  ? BBlocker effect > Strongly prefer in this setting: Bystolic, the most beta -1  selective Beta blocker available in sample form, with bisoprolol the most selective generic choice  on the market, but for now since doesn't really display an strong asthma phenotype or give hx of noct wheeze ok to leave on lopressor at hs

## 2013-05-13 NOTE — Progress Notes (Signed)
Quick Note:  Spoke with pt and notified of results per Dr. Wert. Pt verbalized understanding and denied any questions.  ______ 

## 2013-05-13 NOTE — Assessment & Plan Note (Signed)
Not present on previous study from 11/15/12 so f/u in 3 months p assess pfts

## 2013-05-13 NOTE — Assessment & Plan Note (Signed)
I took an extended  opportunity with this patient to outline the consequences of continued cigarette use  in airway disorders based on all the data we have from the multiple national lung health studies (perfomed over decades at millions of dollars in cost)  indicating that smoking cessation, not choice of inhalers or physicians, is the most important aspect of care.   

## 2013-06-22 ENCOUNTER — Ambulatory Visit (INDEPENDENT_AMBULATORY_CARE_PROVIDER_SITE_OTHER): Payer: BC Managed Care – PPO | Admitting: Internal Medicine

## 2013-06-22 ENCOUNTER — Other Ambulatory Visit (INDEPENDENT_AMBULATORY_CARE_PROVIDER_SITE_OTHER): Payer: BC Managed Care – PPO

## 2013-06-22 ENCOUNTER — Encounter: Payer: Self-pay | Admitting: Internal Medicine

## 2013-06-22 VITALS — BP 134/80 | HR 75 | Temp 98.3°F | Ht 67.0 in | Wt 199.0 lb

## 2013-06-22 DIAGNOSIS — R0609 Other forms of dyspnea: Secondary | ICD-10-CM

## 2013-06-22 DIAGNOSIS — R0600 Dyspnea, unspecified: Secondary | ICD-10-CM

## 2013-06-22 DIAGNOSIS — R911 Solitary pulmonary nodule: Secondary | ICD-10-CM

## 2013-06-22 DIAGNOSIS — I10 Essential (primary) hypertension: Secondary | ICD-10-CM

## 2013-06-22 DIAGNOSIS — J45909 Unspecified asthma, uncomplicated: Secondary | ICD-10-CM

## 2013-06-22 DIAGNOSIS — J449 Chronic obstructive pulmonary disease, unspecified: Secondary | ICD-10-CM

## 2013-06-22 LAB — CBC WITH DIFFERENTIAL/PLATELET
Basophils Relative: 1 % (ref 0.0–3.0)
Eosinophils Relative: 2.1 % (ref 0.0–5.0)
Hemoglobin: 14.6 g/dL (ref 13.0–17.0)
Lymphocytes Relative: 25 % (ref 12.0–46.0)
MCHC: 33.6 g/dL (ref 30.0–36.0)
MCV: 89.3 fl (ref 78.0–100.0)
Monocytes Absolute: 0.9 10*3/uL (ref 0.1–1.0)
Monocytes Relative: 6.2 % (ref 3.0–12.0)
Neutro Abs: 9.2 10*3/uL — ABNORMAL HIGH (ref 1.4–7.7)
Neutrophils Relative %: 65.7 % (ref 43.0–77.0)
RBC: 4.86 Mil/uL (ref 4.22–5.81)
WBC: 14 10*3/uL — ABNORMAL HIGH (ref 4.5–10.5)

## 2013-06-22 LAB — PULMONARY FUNCTION TEST

## 2013-06-22 MED ORDER — NEBIVOLOL HCL 10 MG PO TABS: 10.0000 mg | ORAL_TABLET | Freq: Every day | ORAL | Status: DC

## 2013-06-22 NOTE — Progress Notes (Signed)
PFT done today. 

## 2013-06-22 NOTE — Progress Notes (Signed)
Subjective:    Patient ID: [name], male    DOB: March 25, 1951    MRN: [id number]    Brief patient profile:  62 yowm active smoker referred by hematology (with neg eval for leukocytosis)  to pulmonary clinic 05/12/2013 for sob    History of Present Illness  05/12/2013 1st [facility] office visit/ [name] cc doe indolent onset x one - two  years to point where gives out when has to do a lot of walking = a couple of blocks or up hills - no problems sleeping, no real cough. No change breathing p angioplasty  rec We may to change your blood pressure pill (metaprolol) to alternative but not yet symbicort 160 Take 2 puffs first thing in am  The key is to stop smoking completely before smoking completely stops you - it's the most important aspect of your care    06/22/2013 f/u ov/[name] re: pseudocopd Chief Complaint  Patient presents with  . Follow-up    Pt states SOB and chest tightness are some better since last ov. No new co's today.   some better on symbicort but still sob flat surface when gets  in a hurry.   No obvious day to day or daytime variabilty or assoc chronic cough or cp or  subjective wheeze overt sinus or hb symptoms. No unusual exp hx or h/o childhood pna/ asthma or knowledge of premature birth.  Sleeping ok without nocturnal  or early am exacerbation  of respiratory  c/o's or need for noct saba. Also denies any obvious fluctuation of symptoms with weather or environmental changes or other aggravating or alleviating factors except as outlined above   Current Medications, Allergies, Complete Past Medical History, Past Surgical History, Family History, and Social History were reviewed in [name] record.  ROS  The following are not active complaints unless bolded sore throat, dysphagia, dental problems, itching, sneezing,  nasal congestion or excess/ purulent secretions, ear ache,   fever, chills, sweats, unintended wt loss, pleuritic or  exertional cp, hemoptysis,  orthopnea pnd or leg swelling, presyncope, palpitations, heartburn, abdominal pain, anorexia, nausea, vomiting, diarrhea  or change in bowel or urinary habits, change in stools or urine, dysuria,hematuria,  rash, arthralgias, visual complaints, headache, numbness weakness or ataxia or problems with walking or coordination,  change in mood/affect or memory.                        Objective:   Physical Exam     06/22/2013        199  Wt Readings from Last 3 Encounters:  05/12/13 200 lb 3.2 oz (90.81 kg)  05/04/13 199 lb 8 oz (90.493 kg)  03/05/13 196 lb (88.905 kg)       amb wm with min  pseudowheeze  HEENT: nl dentition, turbinates, and orophanx. Nl external ear canals without cough reflex   NECK :  without JVD/Nodes/TM/ nl carotid upstrokes bilaterally   LUNGS: no acc muscle use, clear to A and P bilaterally without cough on insp or exp maneuvers   CV:  RRR  no s3 or murmur or increase in P2, no edema   ABD:  soft and nontender with nl excursion in the supine position. No bruits or organomegaly, bowel sounds nl  MS:  warm without deformities, calf tenderness, cyanosis or clubbing  SKIN: warm and dry without lesions    NEURO:  alert, approp, no deficits      cxr 05/12/13  No acute infiltrate or pulmonary edema. Mild hyperinflation again noted. Vague nodular density in right upper lobe measures 6.5 mm.     06/22/13 labs bnp < 100,  Bmet/ cbc/TSH ok        Assessment & Plan:

## 2013-06-22 NOTE — Patient Instructions (Addendum)
Stop symbicort inhaler for now Stop toprol (metaprolol) Start bystolic 10 mg one daily in it's place and ok to take twice daily if blood pressure over 130/80  Please see patient coordinator before you leave today  to schedule ct chest at your convenience anytime this week  Please remember to go to the lab department downstairs for your tests - we will call you with the results when they are available.

## 2013-06-23 ENCOUNTER — Encounter: Payer: Self-pay | Admitting: Internal Medicine

## 2013-06-23 ENCOUNTER — Ambulatory Visit (INDEPENDENT_AMBULATORY_CARE_PROVIDER_SITE_OTHER)
Admission: RE | Admit: 2013-06-23 | Discharge: 2013-06-23 | Disposition: A | Discharge status: Home / Self Care | Payer: BC Managed Care – PPO | Source: Ambulatory Visit | Attending: Internal Medicine | Admitting: Internal Medicine

## 2013-06-23 DIAGNOSIS — R0600 Dyspnea, unspecified: Secondary | ICD-10-CM

## 2013-06-23 DIAGNOSIS — R0609 Other forms of dyspnea: Secondary | ICD-10-CM

## 2013-06-23 LAB — BRAIN NATRIURETIC PEPTIDE: Pro B Natriuretic peptide (BNP): 76 pg/mL (ref 0.0–100.0)

## 2013-06-23 LAB — BASIC METABOLIC PANEL
BUN: 15 mg/dL (ref 6–23)
Chloride: 102 mEq/L (ref 96–112)
Creatinine, Ser: 0.9 mg/dL (ref 0.4–1.5)
GFR: 95.65 mL/min (ref >60.00)
Glucose, Bld: 104 mg/dL — ABNORMAL HIGH (ref 70–99)
Potassium: 4.6 mEq/L (ref 3.5–5.1)
Sodium: 138 mEq/L (ref 135–145)

## 2013-06-23 MED ORDER — IOHEXOL 350 MG/ML SOLN
80.0000 mL | Freq: Once | INTRAVENOUS | Status: AC | PRN
  Administered 2013-06-23: 80 mL via INTRAVENOUS

## 2013-06-23 NOTE — Assessment & Plan Note (Addendum)
-    PFT's  06/22/2013 FEV1  2.28 (72%) ratio 78 and no change p B2,  dlco 92% - 06/22/2013  Walked RA x 1  laps @ 185 ft each stopped due to 90% sob  No clear reason for sob identified, needs CTa chest

## 2013-06-23 NOTE — Progress Notes (Signed)
Quick Note:  Spoke with pt and notified of results per Dr. Wert. Pt verbalized understanding and denied any questions.  ______ 

## 2013-06-23 NOTE — Assessment & Plan Note (Addendum)
In the setting of unexplained sob with asthma in ddx, Strongly prefer in this setting: Bystolic, the most beta -1  selective Beta blocker available in sample form, with bisoprolol the most selective generic choice  on the market.   Try bystolic 10 mg one or two daily by self monitor while sort out cause of sob rather than high dose toprol

## 2013-06-23 NOTE — Assessment & Plan Note (Signed)
-   1st seen cxr 05/12/13 > 06/22/2013 CTa ordered >>>

## 2013-06-24 ENCOUNTER — Encounter: Payer: Self-pay | Admitting: Internal Medicine

## 2013-06-24 NOTE — Progress Notes (Signed)
Quick Note:  Spoke with pt and notified of results per Dr. Wert. Pt verbalized understanding and denied any questions.  ______ 

## 2013-07-05 ENCOUNTER — Encounter: Payer: Self-pay | Admitting: Internal Medicine

## 2013-08-06 ENCOUNTER — Ambulatory Visit (INDEPENDENT_AMBULATORY_CARE_PROVIDER_SITE_OTHER): Payer: BC Managed Care – PPO | Admitting: Family Medicine

## 2013-08-06 VITALS — BP 146/80 | HR 78 | Temp 98.3°F | Resp 18 | Ht 67.5 in | Wt 192.4 lb

## 2013-08-06 DIAGNOSIS — M109 Gout, unspecified: Secondary | ICD-10-CM

## 2013-08-06 LAB — COMPREHENSIVE METABOLIC PANEL
ALBUMIN: 3.8 g/dL (ref 3.5–5.2)
ALT: 15 U/L (ref 0–53)
AST: 13 U/L (ref 0–37)
Alkaline Phosphatase: 110 U/L (ref 39–117)
BUN: 15 mg/dL (ref 6–23)
CALCIUM: 9.9 mg/dL (ref 8.4–10.5)
CO2: 30 mEq/L (ref 19–32)
CREATININE: 0.81 mg/dL (ref 0.50–1.35)
Chloride: 100 mEq/L (ref 96–112)
GLUCOSE: 97 mg/dL (ref 70–99)
Potassium: 5.2 mEq/L (ref 3.5–5.3)
Sodium: 137 mEq/L (ref 135–145)
Total Bilirubin: 0.5 mg/dL (ref 0.3–1.2)
Total Protein: 6.8 g/dL (ref 6.0–8.3)

## 2013-08-06 LAB — URIC ACID: Uric Acid, Serum: 6.5 mg/dL (ref 4.0–7.8)

## 2013-08-06 MED ORDER — PREDNISONE 20 MG PO TABS: ORAL_TABLET | ORAL | Status: DC

## 2013-08-06 MED ORDER — INDOMETHACIN 25 MG PO CAPS: 25.0000 mg | ORAL_CAPSULE | Freq: Three times a day (TID) | ORAL | Status: DC

## 2013-08-06 NOTE — Progress Notes (Signed)
Subjective: 63 year old man with prior history of gout. He has been hurting a lot in his TMs, left especially. Some of his elbows. Some other joints that ache also. He occasionally splurges a little on his eating, though not a regular basis. 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  Objective: Pleasant gentleman in no major distress. His left wrist is very tender with a little bit of effusion. There is no erythema. Grip is poor in his left hand because of the pain. Right hand is a little bit better but still hurts. Elbows also her normal.  Assessment: Chronic gouty arthritis  Plan: The recheck uric acid and C. met. Treat for the acute gout with prednisone and indomethacin. If he is not improving he is to let us know and we would add colchicine.  Assuming the uric acid level is still up, we'll probably need to add some allopurinol. We'll await test first.

## 2013-08-06 NOTE — Patient Instructions (Signed)
Take the indomethacin one pill 3 times daily until the gout is improved, then cut back to one or 2 pills daily. Take with food. If he gets stomach pain or any concern or bleeding stop it immediately.  Take the prednisone 3 pills daily after breakfast for 2 days, then 2 pills daily for 2 days, then one pill daily for 2 days also for the gout.  If you do not hear from me over the next week regarding your other labs please contact me. I may want to put you on a long-term treatment of allopurinol.  Return as needed

## 2013-11-01 ENCOUNTER — Ambulatory Visit: Payer: BC Managed Care – PPO

## 2013-11-01 ENCOUNTER — Ambulatory Visit (INDEPENDENT_AMBULATORY_CARE_PROVIDER_SITE_OTHER): Payer: BC Managed Care – PPO | Admitting: Family Medicine

## 2013-11-01 VITALS — BP 128/82 | HR 98 | Temp 98.1°F | Resp 18 | Ht 67.0 in | Wt 193.0 lb

## 2013-11-01 DIAGNOSIS — M1711 Unilateral primary osteoarthritis, right knee: Secondary | ICD-10-CM

## 2013-11-01 DIAGNOSIS — R05 Cough: Secondary | ICD-10-CM

## 2013-11-01 DIAGNOSIS — M109 Gout, unspecified: Secondary | ICD-10-CM

## 2013-11-01 DIAGNOSIS — IMO0002 Reserved for concepts with insufficient information to code with codable children: Secondary | ICD-10-CM

## 2013-11-01 DIAGNOSIS — R059 Cough, unspecified: Secondary | ICD-10-CM

## 2013-11-01 DIAGNOSIS — M1710 Unilateral primary osteoarthritis, unspecified knee: Secondary | ICD-10-CM

## 2013-11-01 MED ORDER — METHYLPREDNISOLONE ACETATE 80 MG/ML IJ SUSP
80.0000 mg | Freq: Once | INTRAMUSCULAR | Status: AC
  Administered 2013-11-01: 80 mg via INTRA_ARTICULAR

## 2013-11-01 MED ORDER — AZITHROMYCIN 250 MG PO TABS: ORAL_TABLET | ORAL | Status: DC

## 2013-11-01 MED ORDER — PREDNISONE 20 MG PO TABS: ORAL_TABLET | ORAL | Status: DC

## 2013-11-01 NOTE — Progress Notes (Signed)
   Subjective:    Patient ID: [name], male    DOB: 12-11-1950, 63 y.o.   MRN: [id number] This chart was scribed for Jeremy Sidle, MD, by Marica Otter, ED Scribe. This patient was seen in room 11 and the patient's care was started at 8:04.    PCP: Jeremy Sidle, MD  HPI  HPI Comments: [name] is a 63 y.o. male, with a history of HTN, HLD, chest pain, claudication, CAD, leukocytosis, dyspnea and solitary pulmonary nodule, who presents to the Emergency Department complaining of multiple symptoms.   For his first complaint, pt reports intermittent right knee pain with associated swelling onset 08/2013. Pt reports that in 08/2013 he had fluid drawn from his right knee, however, he does not recall if they put any meds in his right knee at that time. Pt reports the current swelling and pain is less severe than that experienced in 08/2013. Pt is recently retired, however, his job required him to be very active, including a lot of walking.   Pt also complains of a cough and associated congestion onset one month ago. Pt reports he had a fever one week ago, he took a lot of fluids and over the counter meds, and his symptoms resolved. Pt reports he has used an inhaler in the past, however, ceased using an inhaler when his physician changed his blood pressure med last year and told him to discontinue use of his inhaler.   Pt is a current smoker.   Review of Systems  Constitutional: Positive for fever.  Respiratory: Positive for cough.   Cardiovascular: Negative for leg swelling (right knee).  Musculoskeletal:       Right knee swelling and pain   All other systems reviewed and are negative.      Objective:   Physical Exam  Right knee shows moderate to severe effusion with tenderness at the medial joint line. Is no bony abnormality noted HEENT: Unremarkable except for moderate erythema of the throat Chest: Bilateral rhonchi  [facility] reading (PRIMARY) by  Dr. Milus Glazier: Minimal  arthritis of the knee with moderate effusion, chest x-ray shows increased markings without infiltrate or unusual densities, no cardiomegaly. : After informed consent and Betadine prep, right knee was injected with 1 cc of Depo-Medrol and 1 cc of Marcaine with almost immediate relief of pain.    Assessment & Plan:    COORDINATION OF CARE: 8:10 AM-Discussed treatment plan which includes shot of cortisone with pt at bedside and pt agreed to plan.   Osteoarthritis of right knee - Plan: DG Knee Complete 4 Views Right, predniSONE (DELTASONE) 20 MG tablet, methylPREDNISolone acetate (DEPO-MEDROL) injection 80 mg  Cough - Plan: DG Chest 2 View, predniSONE (DELTASONE) 20 MG tablet, azithromycin (ZITHROMAX Z-PAK) 250 MG tablet  Gout - Plan: predniSONE (DELTASONE) 20 MG tablet, methylPREDNISolone acetate (DEPO-MEDROL) injection 80 mg  Signed, Jeremy Sidle, MD

## 2013-12-06 ENCOUNTER — Ambulatory Visit (INDEPENDENT_AMBULATORY_CARE_PROVIDER_SITE_OTHER): Payer: BC Managed Care – PPO | Admitting: Emergency Medicine

## 2013-12-06 VITALS — BP 156/94 | HR 97 | Temp 99.2°F | Resp 16 | Ht 66.25 in | Wt 195.0 lb

## 2013-12-06 DIAGNOSIS — J209 Acute bronchitis, unspecified: Secondary | ICD-10-CM

## 2013-12-06 DIAGNOSIS — R059 Cough, unspecified: Secondary | ICD-10-CM

## 2013-12-06 DIAGNOSIS — M109 Gout, unspecified: Secondary | ICD-10-CM

## 2013-12-06 DIAGNOSIS — R05 Cough: Secondary | ICD-10-CM

## 2013-12-06 DIAGNOSIS — J018 Other acute sinusitis: Secondary | ICD-10-CM

## 2013-12-06 MED ORDER — PROMETHAZINE-CODEINE 6.25-10 MG/5ML PO SYRP: 5.0000 mL | ORAL_SOLUTION | Freq: Four times a day (QID) | ORAL | Status: DC | PRN

## 2013-12-06 MED ORDER — COLCHICINE 0.6 MG PO TABS: ORAL_TABLET | ORAL | Status: DC

## 2013-12-06 MED ORDER — INDOMETHACIN 25 MG PO CAPS: 25.0000 mg | ORAL_CAPSULE | Freq: Three times a day (TID) | ORAL | Status: DC

## 2013-12-06 MED ORDER — AMOXICILLIN-POT CLAVULANATE 875-125 MG PO TABS: 1.0000 | ORAL_TABLET | Freq: Two times a day (BID) | ORAL | Status: DC

## 2013-12-06 NOTE — Progress Notes (Signed)
[facility] [address] [place] [address] [phone]  Date:  12/06/2013   Name:  [name]   DOB:  30-Jan-1951   MRN:  [id number]  PCP:  [name], MD    Chief Complaint: Sinus Problem and Knee Pain   History of Present Illness:  [name] is a 63 y.o. very pleasant male patient who presents with the following:  Has nasal congestion and post nasal drip.  Cough productive of mucopurulent sputum.  Has no wheezing or shortness of breath.   Mucopurulent nasal drainage.  No fever or chills.  No nausea or vomiting.  No stool change or rash.  Works outside in [employer]eron[employer].   No history of injury but has swelling and pain in right knee.  No instability.  Says he has undergone two prior injections with steroid in the knee this year.  Has not seen an orthopod but has a  Diagnosis of gout.  No improvement with over the counter medications or other home remedies. Denies other complaint or health concern today.   Patient Active Problem List   Diagnosis Date Noted  . Smoking 05/13/2013  . Solitary pulmonary nodule 05/13/2013  . Asthma 05/12/2013  . Dyspnea 05/04/2013  . Gout attack 03/05/2013  . Leukocytosis   . Claudication 08/07/2011  . HTN (hypertension) 08/07/2011  . HLD (hyperlipidemia) 08/07/2011  . CAD (coronary artery disease) 08/07/2011  . Chest pain 07/12/2011    Past Medical History  Diagnosis Date  . CAD (coronary artery disease)     LHC 1/08: mLAD 30-50%, mCFX 30%, left PDA 30%,  EF 55%;    Myoview  07/31/11: EF 54%, no scar or ischemia, + chest pain, + hypotensive response  . Hypertension   . Hyperlipidemia   . Chest pain   . Leukocytosis   . Gout     Past Surgical History  Procedure Laterality Date  . Appendectomy    . Hemorrhoid surgery      History  Substance Use Topics  . Smoking status: Current Every Day Smoker -- 1.00 packs/day for 45 years    Types: Cigarettes  . Smokeless tobacco: Never Used  . Alcohol Use:  No    Family History  Problem Relation Age of Onset  . Coronary artery disease      Father with MI in his 72s  . Heart attack Father   . Cancer Brother     lung  . Heart attack Paternal Grandfather   . Stroke Brother   . Coronary artery disease Brother   . Heart attack Brother     No Known Allergies  Medication list has been reviewed and updated.  Current Outpatient Prescriptions on File Prior to Visit  Medication Sig Dispense Refill  . amLODipine (NORVASC) 5 MG tablet Take 1 tablet (5 mg total) by mouth daily.  30 tablet  3  . aspirin EC 81 MG tablet Take 81 mg by mouth daily.      Marland Kitchen azithromycin (ZITHROMAX Z-PAK) 250 MG tablet Take as directed on pack  6 tablet  0  . indomethacin (INDOCIN) 25 MG capsule Take 1 capsule (25 mg total) by mouth 3 (three) times daily with meals.  30 capsule  0  . nebivolol (BYSTOLIC) 10 MG tablet Take 1 tablet (10 mg total) by mouth daily.      . nitroGLYCERIN (NITROSTAT) 0.4 MG SL tablet Place 0.4 mg under the tongue every 5 (five) minutes as needed for chest pain.      Marland Kitchen  predniSONE (DELTASONE) 20 MG tablet Take 3 pills daily for 2 days, then 2 daily for 2 days, then one daily for 2 days for gout  12 tablet  0  . losartan (COZAAR) 100 MG tablet Take 1 tablet (100 mg total) by mouth daily.  30 tablet  11   No current facility-administered medications on file prior to visit.    Review of Systems:  As per HPI, otherwise negative.    Physical Examination: Filed Vitals:   12/06/13 1815  BP: 156/94  Pulse: 97  Temp: 99.2 F (37.3 C)  Resp: 16   Filed Vitals:   12/06/13 1815  Height: 5' 6.25" (1.683 m)  Weight: 195 lb (88.451 kg)   Body mass index is 31.23 kg/(m^2). Ideal Body Weight: Weight in (lb) to have BMI = 25: 155.7  GEN: WDWN, NAD, Non-toxic, A & O x 3 HEENT: Atraumatic, Normocephalic. Neck supple. No masses, No LAD.   Ears and Nose: No external deformity.  Purulent nasal discharge CV: RRR, No M/G/R. No JVD. No thrill. No  extra heart sounds. PULM: CTA B, no wheezes, crackles, rhonchi. No retractions. No resp. distress. No accessory muscle use. ABD: S, NT, ND, +BS. No rebound. No HSM. EXTR: No c/c/e  Right knee moderate effusion and tenderness.  No cellulitis. NEURO Normal gait.  PSYCH: Normally interactive. Conversant. Not depressed or anxious appearing.  Calm demeanor.    Assessment and Plan: Sinusitis  Bronchitis Gout augmentin  Phen c cod colcrys Indocin  Signed,  Phillips Odor, MD

## 2013-12-06 NOTE — Patient Instructions (Signed)

## 2013-12-09 ENCOUNTER — Telehealth: Payer: Self-pay

## 2013-12-09 ENCOUNTER — Ambulatory Visit (INDEPENDENT_AMBULATORY_CARE_PROVIDER_SITE_OTHER): Payer: BC Managed Care – PPO | Admitting: Emergency Medicine

## 2013-12-09 VITALS — BP 152/84 | HR 101 | Temp 98.2°F | Resp 18 | Ht 68.0 in | Wt 192.0 lb

## 2013-12-09 DIAGNOSIS — R197 Diarrhea, unspecified: Secondary | ICD-10-CM

## 2013-12-09 MED ORDER — ONDANSETRON 8 MG PO TBDP: 8.0000 mg | ORAL_TABLET | Freq: Three times a day (TID) | ORAL | Status: DC | PRN

## 2013-12-09 MED ORDER — LOPERAMIDE HCL 2 MG PO TABS: ORAL_TABLET | ORAL | Status: DC

## 2013-12-09 NOTE — Patient Instructions (Signed)
Clostridium Difficile Infection Clostridium difficile (C. difficile) is a bacteria found in the intestinal tract or colon. Under certain conditions, it causes diarrhea and sometimes severe disease. The severe form of the disease is known as pseudomembranous colitis (often called C. difficile colitis). This disease can damage the lining of the colon or cause the colon to become enlarged (toxic megacolon).  CAUSES  Your colon normally contains many different bacteria, including C. difficile. The balance of bacteria in your colon can change during illness. This is especially true when you take antibiotic medicine. Taking antibiotics may allow the C. difficile to grow, multiply excessively, and make a toxin that then causes illness. The elderly and people with certain medical conditions have a greater risk of getting C. difficile infections. SYMPTOMS   Watery diarrhea.  Fever.  Fatigue.  Loss of appetite.  Nausea.  Abdominal swelling, pain, or tenderness.  Dehydration. DIAGNOSIS  Your symptoms may make your caregiver suspicious of a C. difficile infection, especially if you have used antibiotics in the preceding weeks. However, there are only 2 ways to know for certain whether you have a C. difficile infection:  A lab test that finds the toxin in your stool.  The specific appearance of an abnormality (pseudomembrane) in your colon. This can only be seen by doing a sigmoidoscopy or colonoscopy. These procedures involve passing an instrument through your rectum to look at the inside of your colon. Your caregiver will help determine if these tests are necessary. TREATMENT   Most people are successfully treated with one of two specific antibiotics, usually given by mouth. Other antibiotics you are receiving are stopped if possible.  Intravenous (IV) fluids and correction of electrolyte imbalance may be necessary.  Rarely, surgery may be needed to remove the infected part of the  intestines.  Careful hand washing by you and your caregivers is important to prevent the spread of infection. In the hospital, your caregivers may also put on gowns and gloves to prevent the spread of the C. difficile bacteria. Your room is also cleaned regularly with a solution containing bleach or a product that is known to kill C. difficile. HOME CARE INSTRUCTIONS  Drink enough fluids to keep your urine clear or pale yellow. Avoid milk, caffeine, and alcohol.  Ask your caregiver for specific rehydration instructions.  Try eating small, frequent meals rather than large meals.  Take your antibiotics as directed. Finish them even if you start to feel better.  Do not use medicines to slow diarrhea. This could delay healing or cause complications.  Wash your hands thoroughly after using the bathroom and before preparing food.  Make sure people who live with you wash their hands often, too.  Carefully disinfect all surfaces with a product that contains chlorine bleach. SEEK MEDICAL CARE IF:  Diarrhea persists longer than expected or recurs after completing your course of antibiotic treatment for the C. difficile infection.  You have trouble staying hydrated. SEEK IMMEDIATE MEDICAL CARE IF:  You develop a new fever.  You have increasing abdominal pain or tenderness.  There is blood in your stools, or your stools are dark black and tarry.  You cannot hold down food or liquids. MAKE SURE YOU:   Understand these instructions.  Will watch your condition.  Will get help right away if you are not doing well or get worse. Document Released: 04/17/2005 Document Revised: 11/02/2012 Document Reviewed: 12/14/2010 Exit[employer] Patient Information 2014 [employer].  

## 2013-12-09 NOTE — Progress Notes (Signed)
[facility] [address], [place] [address] [phone]  Date:  12/09/2013   Name:  [name]   DOB:  [birth year]   MRN:  [id number]  PCP:  [name], MD    Chief Complaint: Emesis and Diarrhea   History of Present Illness:  [name] is a 63 y.o. very pleasant male patient who presents with the following:  Since taking the first dose of medications has experienced nausea and vomiting and poor appetite.  Only took initial dose of meds and stopped.  Otherwise unchanged.  Says stools loose despite pepto bismol.  No further nausea or vomiting.  No rash or fever.  No improvement with over the counter medications or other home remedies. Denies other complaint or health concern today.   Patient Active Problem List   Diagnosis Date Noted  . Smoking 05/13/2013  . Solitary pulmonary nodule 05/13/2013  . Asthma 05/12/2013  . Dyspnea 05/04/2013  . Gout attack 03/05/2013  . Leukocytosis   . Claudication 08/07/2011  . HTN (hypertension) 08/07/2011  . HLD (hyperlipidemia) 08/07/2011  . CAD (coronary artery disease) 08/07/2011  . Chest pain 07/12/2011    Past Medical History  Diagnosis Date  . CAD (coronary artery disease)     LHC 1/08: mLAD 30-50%, mCFX 30%, left PDA 30%,  EF 55%;    Myoview  07/31/11: EF 54%, no scar or ischemia, + chest pain, + hypotensive response  . Hypertension   . Hyperlipidemia   . Chest pain   . Leukocytosis   . Gout     Past Surgical History  Procedure Laterality Date  . Appendectomy    . Hemorrhoid surgery      History  Substance Use Topics  . Smoking status: Current Every Day Smoker -- 1.00 packs/day for 45 years    Types: Cigarettes  . Smokeless tobacco: Never Used  . Alcohol Use: No    Family History  Problem Relation Age of Onset  . Coronary artery disease      Father with MI in his 21s  . Heart attack Father   . Cancer Brother     lung  . Heart attack Paternal Grandfather   . Stroke Brother    . Coronary artery disease Brother   . Heart attack Brother     No Known Allergies  Medication list has been reviewed and updated.  Current Outpatient Prescriptions on File Prior to Visit  Medication Sig Dispense Refill  . amLODipine (NORVASC) 5 MG tablet Take 1 tablet (5 mg total) by mouth daily.  30 tablet  3  . amoxicillin-clavulanate (AUGMENTIN) 875-125 MG per tablet Take 1 tablet by mouth 2 (two) times daily.  20 tablet  0  . aspirin EC 81 MG tablet Take 81 mg by mouth daily.      . colchicine 0.6 MG tablet 2 now and one in one hour.  Tomorrow one BID  45 tablet  0  . indomethacin (INDOCIN) 25 MG capsule Take 1 capsule (25 mg total) by mouth 3 (three) times daily with meals.  45 capsule  0  . nebivolol (BYSTOLIC) 10 MG tablet Take 1 tablet (10 mg total) by mouth daily.      . nitroGLYCERIN (NITROSTAT) 0.4 MG SL tablet Place 0.4 mg under the tongue every 5 (five) minutes as needed for chest pain.      . promethazine-codeine (PHENERGAN WITH CODEINE) 6.25-10 MG/5ML syrup Take 5-10 mLs by mouth every 6 (six) hours as needed.  120 mL  0  . azithromycin (ZITHROMAX Z-PAK) 250 MG tablet Take as directed on pack  6 tablet  0  . losartan (COZAAR) 100 MG tablet Take 1 tablet (100 mg total) by mouth daily.  30 tablet  11  . predniSONE (DELTASONE) 20 MG tablet Take 3 pills daily for 2 days, then 2 daily for 2 days, then one daily for 2 days for gout  12 tablet  0   No current facility-administered medications on file prior to visit.    Review of Systems:  As per HPI, otherwise negative.    Physical Examination: Filed Vitals:   12/09/13 1058  BP: 152/84  Pulse: 101  Temp: 98.2 F (36.8 C)  Resp: 18   Filed Vitals:   12/09/13 1058  Height: 5\' 8"  (1.727 m)  Weight: 192 lb (87.091 kg)   Body mass index is 29.2 kg/(m^2). Ideal Body Weight: Weight in (lb) to have BMI = 25: 164.1  GEN: WDWN, NAD, Non-toxic, A & O x 3 HEENT: Atraumatic, Normocephalic. Neck supple. No masses, No  LAD. Ears and Nose: No external deformity. CV: RRR, No M/G/R. No JVD. No thrill. No extra heart sounds. PULM: CTA B, no wheezes, crackles, rhonchi. No retractions. No resp. distress. No accessory muscle use. ABD: S, NT, ND, +BS. No rebound. No HSM. EXTR: No c/c/e NEURO Normal gait.  PSYCH: Normally interactive. Conversant. Not depressed or anxious appearing.  Calm demeanor.    Assessment and Plan: Stop codeine.   Check c dif Imodium zofran  Signed,  , MD

## 2013-12-10 ENCOUNTER — Other Ambulatory Visit: Payer: Self-pay | Admitting: Emergency Medicine

## 2013-12-11 LAB — C. DIFFICILE GDH AND TOXIN A/B
C. DIFFICILE GDH: NOT DETECTED
C. difficile Toxin A/B: NOT DETECTED

## 2014-01-10 ENCOUNTER — Other Ambulatory Visit: Payer: Self-pay | Admitting: Family Medicine

## 2014-01-10 ENCOUNTER — Ambulatory Visit (INDEPENDENT_AMBULATORY_CARE_PROVIDER_SITE_OTHER): Payer: BC Managed Care – PPO | Admitting: Family Medicine

## 2014-01-10 ENCOUNTER — Other Ambulatory Visit: Payer: Self-pay | Admitting: Cardiovascular Disease

## 2014-01-10 VITALS — BP 162/94 | HR 86 | Temp 97.7°F | Resp 18 | Ht 68.0 in | Wt 197.0 lb

## 2014-01-10 DIAGNOSIS — M25461 Effusion, right knee: Secondary | ICD-10-CM

## 2014-01-10 DIAGNOSIS — M25569 Pain in unspecified knee: Secondary | ICD-10-CM

## 2014-01-10 DIAGNOSIS — M109 Gout, unspecified: Secondary | ICD-10-CM

## 2014-01-10 DIAGNOSIS — M25469 Effusion, unspecified knee: Secondary | ICD-10-CM

## 2014-01-10 DIAGNOSIS — M25561 Pain in right knee: Secondary | ICD-10-CM

## 2014-01-10 LAB — POCT CBC
Granulocyte percent: 74.1 %G (ref 37–80)
HCT, POC: 41.9 % — AB (ref 43.5–53.7)
Hemoglobin: 13.4 g/dL — AB (ref 14.1–18.1)
Lymph, poc: 2.4 (ref 0.6–3.4)
MCH, POC: 29.7 pg (ref 27–31.2)
MCHC: 32 g/dL (ref 31.8–35.4)
MCV: 92.9 fL (ref 80–97)
MID (cbc): 0.7 (ref 0–0.9)
MPV: 7.9 fL (ref 0–99.8)
POC Granulocyte: 8.9 — AB (ref 2–6.9)
POC LYMPH PERCENT: 19.9 %L (ref 10–50)
POC MID %: 6 %M (ref 0–12)
Platelet Count, POC: 470 10*3/uL — AB (ref 142–424)
RBC: 4.51 M/uL — AB (ref 4.69–6.13)
RDW, POC: 12.8 %
WBC: 12 10*3/uL — AB (ref 4.6–10.2)

## 2014-01-10 LAB — COMPLETE METABOLIC PANEL WITH GFR
ALT: 14 U/L (ref 0–53)
AST: 11 U/L (ref 0–37)
Albumin: 3.6 g/dL (ref 3.5–5.2)
Alkaline Phosphatase: 89 U/L (ref 39–117)
Calcium: 9.3 mg/dL (ref 8.4–10.5)
GFR, Est Non African American: >89 mL/min
Potassium: 4.4 mEq/L (ref 3.5–5.3)
Sodium: 138 mEq/L (ref 135–145)
Total Bilirubin: 0.4 mg/dL (ref 0.2–1.2)
Total Protein: 6.3 g/dL (ref 6.0–8.3)

## 2014-01-10 LAB — COMPLETE METABOLIC PANEL WITHOUT GFR
BUN: 15 mg/dL (ref 6–23)
CO2: 30 meq/L (ref 19–32)
Chloride: 101 meq/L (ref 96–112)
Creat: 0.77 mg/dL (ref 0.50–1.35)
GFR, Est African American: >89 mL/min
Glucose, Bld: 95 mg/dL (ref 70–99)

## 2014-01-10 MED ORDER — PREDNISONE 20 MG PO TABS: ORAL_TABLET | ORAL | Status: DC

## 2014-01-10 MED ORDER — HYDROCODONE-ACETAMINOPHEN 5-325 MG PO TABS: 1.0000 | ORAL_TABLET | Freq: Four times a day (QID) | ORAL | Status: DC | PRN

## 2014-01-10 NOTE — Patient Instructions (Signed)

## 2014-01-10 NOTE — Progress Notes (Signed)
Chief Complaint:  Chief Complaint  Patient presents with  . Gout    rt knee since yesterday     HPI: [name] is a 63 y.o. male who is here for a 1 day history of Right knee pain, fluid buildup ; he ahs a history of gout in his left wrist and also right knee. Had been taking colcrys and also indomethacine without releif.  Denies any fevers or chills. Has a hard time bending and walking due to paina nd swelling, he was here in 11/01/13 for joint apsiration and also steroid injection. He then return for another flare up but was only given meds. He was referred to rheumatology but he missed that appt because his sister was dx with pancreatic cancer in [place] and only has 6 months to live and her children are pretty nonhelpful so he was out there with her. He denies any kidney damage or DM.   Past Medical History  Diagnosis Date  . CAD (coronary artery disease)     LHC 1/08: mLAD 30-50%, mCFX 30%, left PDA 30%,  EF 55%;    Myoview  07/31/11: EF 54%, no scar or ischemia, + chest pain, + hypotensive response  . Hypertension   . Hyperlipidemia   . Chest pain   . Leukocytosis   . Gout    Past Surgical History  Procedure Laterality Date  . Appendectomy    . Hemorrhoid surgery     History   Social History  . Marital Status: Single    Spouse Name: N/A    Number of Children: N/A  . Years of Education: N/A   Social History Main Topics  . Smoking status: Current Every Day Smoker -- 1.00 packs/day for 45 years    Types: Cigarettes  . Smokeless tobacco: Never Used  . Alcohol Use: No  . Drug Use: No  . Sexual Activity: None   Other Topics Concern  . None   Social History Narrative  . None   Family History  Problem Relation Age of Onset  . Coronary artery disease      Father with MI in his 9s  . Heart attack Father   . Cancer Brother     lung  . Heart attack Paternal Grandfather   . Stroke Brother   . Coronary artery disease Brother   . Heart attack Brother     No Known Allergies Prior to Admission medications   Medication Sig Start Date End Date Taking? Authorizing Provider  amLODipine (NORVASC) 5 MG tablet Take 1 tablet (5 mg total) by mouth daily. 11/16/12  Yes Phillips Odor, MD  aspirin EC 81 MG tablet Take 81 mg by mouth daily.   Yes Historical Provider, MD  colchicine 0.6 MG tablet 2 now and one in one hour.  Tomorrow one BID 12/06/13  Yes Phillips Odor, MD  indomethacin (INDOCIN) 25 MG capsule Take 1 capsule (25 mg total) by mouth 3 (three) times daily with meals. 12/06/13  Yes Phillips Odor, MD  losartan (COZAAR) 100 MG tablet Take 1 tablet (100 mg total) by mouth daily. 03/24/12 01/10/14 Yes Tonny Bollman, MD  nebivolol (BYSTOLIC) 10 MG tablet Take 1 tablet (10 mg total) by mouth daily. 06/22/13  Yes Nyoka Cowden, MD  nitroGLYCERIN (NITROSTAT) 0.4 MG SL tablet Place 0.4 mg under the tongue every 5 (five) minutes as needed for chest pain.   Yes Historical Provider, MD  amoxicillin-clavulanate (AUGMENTIN) 875-125 MG per tablet Take 1 tablet  by mouth 2 (two) times daily. 12/06/13   Phillips Odor, MD  azithromycin (ZITHROMAX Z-PAK) 250 MG tablet Take as directed on pack 11/01/13   [name], MD  loperamide (IMODIUM A-D) 2 MG tablet 2 now and one hourly prn diarrhea.  Max 8 tabs in 24 hours 12/09/13   Phillips Odor, MD  ondansetron (ZOFRAN-ODT) 8 MG disintegrating tablet Take 1 tablet (8 mg total) by mouth every 8 (eight) hours as needed for nausea. 12/09/13   Phillips Odor, MD  predniSONE (DELTASONE) 20 MG tablet Take 3 pills daily for 2 days, then 2 daily for 2 days, then one daily for 2 days for gout 11/01/13   [name], MD  promethazine-codeine [facility] WITH CODEINE) 6.25-10 MG/5ML syrup Take 5-10 mLs by mouth every 6 (six) hours as needed. 12/06/13   Phillips Odor, MD     ROS: The patient denies fevers, chills, night sweats, unintentional weight loss, chest pain, palpitations, wheezing, dyspnea on exertion, nausea,  vomiting, abdominal pain, dysuria, hematuria, melena, numbness, weakness, or tingling.  All other systems have been reviewed and were otherwise negative with the exception of those mentioned in the HPI and as above.    PHYSICAL EXAM: Filed Vitals:   01/10/14 1107  BP: 162/94  Pulse: 86  Temp: 97.7 F (36.5 C)  Resp: 18   Filed Vitals:   01/10/14 1107  Height: 5\' 8"  (1.727 m)  Weight: 197 lb (89.359 kg)   Body mass index is 29.96 kg/(m^2).  General: Alert, no acute distress HEENT:  Normocephalic, atraumatic, oropharynx patent. EOMI, PERRLA Cardiovascular:  Regular rate and rhythm, no rubs murmurs or gallops.  Radial pulse intact. No pedal edema.  Respiratory: Clear to auscultation bilaterally.  + minimal wheezes, No rales, or rhonchi.  No cyanosis, no use of accessory musculature GI: No organomegaly, abdomen is soft and non-tender, positive bowel sounds.  No masses. Skin: No rashes. NO erythema, minimal wramth of right knee Neurologic: Facial musculature symmetric. Psychiatric: Patient is appropriate throughout our interaction. Lymphatic: No cervical lymphadenopathy Musculoskeletal: Gait antalgic Knee-. Right knee effusion, + minimal warmth, decr ROM due to swelling.  Tender  With palpation but mostly with movement No e/o cellulitis   LABS: Results for orders placed in visit on 01/10/14  POCT CBC      Result Value Ref Range   WBC 12.0 (*) 4.6 - 10.2 K/uL   Lymph, poc 2.4  0.6 - 3.4   POC LYMPH PERCENT 19.9  10 - 50 %L   MID (cbc) 0.7  0 - 0.9   POC MID % 6.0  0 - 12 %M   POC Granulocyte 8.9 (*) 2 - 6.9   Granulocyte percent 74.1  37 - 80 %G   RBC 4.51 (*) 4.69 - 6.13 M/uL   Hemoglobin 13.4 (*) 14.1 - 18.1 g/dL   HCT, POC 01/12/14 (*) 12.4 - 53.7 %   MCV 92.9  80 - 97 fL   MCH, POC 29.7  27 - 31.2 pg   MCHC 32.0  31.8 - 35.4 g/dL   RDW, POC 58.0     Platelet Count, POC 470 (*) 142 - 424 K/uL   MPV 7.9  0 - 99.8 fL     EKG/XRAY:   Primary read interpreted by Dr. 99.8  at [facility].   ASSESSMENT/PLAN: Encounter Diagnoses  Name Primary?  . Right knee pain Yes  . Knee effusion, right   . Acute gout of right knee, unspecified cause    After verbal consent  patient's knee was aspirated No skin erythema or wound noted. Risks (including but not limited to bleeding and infection), benefits, and alternatives discussed for superolateral R knee aspiration. Verbal consent obtained after any questions were answered., and verbal understanding expressed. Landmarks noted, and marked as needed. Area cleansed with Betadine x3, ethyl chloride spray for topical anesthesia, followed by alcohol swab. 18 gauge needle used. Patient was injected with 4 ml of plain marcaine, no depomedrol given since he got one already in 10/2013 and also has gout in wrist.  No xrays taken since had xrays in 10/2013 50 CC of yellow straw like clear liquid obtaine from synvoail fluid, send out fo cell coutn and cx Continue with colcrys Rx prednisone since has both gout in wrist and knee Check CMP       Gross sideeffects, risk and benefits, and alternatives of medications d/w patient. Patient is aware that all medications have potential sideeffects and we are unable to predict every sideeffect or drug-drug interaction that may occur.  LE, THAO PHUONG, DO 01/10/2014 1:20 PM

## 2014-01-11 LAB — SYNOVIAL CELL COUNT + DIFF, W/ CRYSTALS
Crystals, Fluid: NONE SEEN
Eosinophils-Synovial: 0 % (ref 0–1)
Lymphocytes-Synovial Fld: 2 % (ref 0–20)
Monocyte/Macrophage: 4 % — ABNORMAL LOW (ref 50–90)
Neutrophil, Synovial: 94 % — ABNORMAL HIGH (ref 0–25)
WBC, Synovial: 40055 cu mm — ABNORMAL HIGH (ref 0–200)

## 2014-01-15 LAB — BODY FLUID CULTURE
Gram Stain: NONE SEEN
Organism ID, Bacteria: NO GROWTH

## 2014-01-17 NOTE — Telephone Encounter (Signed)
Please contact the pt and if the pt schedules an office visit with Dr Excell Seltzer or PA/NP then we can refill medication until appointment. If the pt is not planning on following up with our office please document who is filling this medication for the patient. Thank you.

## 2014-01-20 ENCOUNTER — Other Ambulatory Visit: Payer: Self-pay

## 2014-01-20 DIAGNOSIS — I10 Essential (primary) hypertension: Secondary | ICD-10-CM

## 2014-01-20 DIAGNOSIS — E7800 Pure hypercholesterolemia, unspecified: Secondary | ICD-10-CM

## 2014-01-20 MED ORDER — LOSARTAN POTASSIUM 100 MG PO TABS: 100.0000 mg | ORAL_TABLET | Freq: Every day | ORAL | Status: DC

## 2014-01-26 ENCOUNTER — Encounter: Payer: Self-pay | Admitting: Family Medicine

## 2014-01-27 NOTE — Telephone Encounter (Signed)
No msg °

## 2014-01-28 ENCOUNTER — Ambulatory Visit (INDEPENDENT_AMBULATORY_CARE_PROVIDER_SITE_OTHER): Payer: BC Managed Care – PPO | Admitting: Family Medicine

## 2014-01-28 VITALS — BP 162/100 | HR 95 | Temp 98.3°F | Resp 16 | Ht 68.0 in | Wt 194.0 lb

## 2014-01-28 DIAGNOSIS — M1710 Unilateral primary osteoarthritis, unspecified knee: Secondary | ICD-10-CM

## 2014-01-28 DIAGNOSIS — Z23 Encounter for immunization: Secondary | ICD-10-CM

## 2014-01-28 DIAGNOSIS — M25569 Pain in unspecified knee: Secondary | ICD-10-CM

## 2014-01-28 DIAGNOSIS — M1711 Unilateral primary osteoarthritis, right knee: Secondary | ICD-10-CM

## 2014-01-28 DIAGNOSIS — M25561 Pain in right knee: Secondary | ICD-10-CM

## 2014-01-28 LAB — SYNOVIAL CELL COUNT + DIFF, W/ CRYSTALS
Crystals, Fluid: NONE SEEN
EOSINOPHILS-SYNOVIAL: 0 % (ref 0–1)
Lymphocytes-Synovial Fld: 5 % (ref 0–20)
Monocyte/Macrophage: 0 % — ABNORMAL LOW (ref 50–90)
NEUTROPHIL, SYNOVIAL: 95 % — AB (ref 0–25)
WBC, SYNOVIAL: 18565 uL — AB (ref 0–200)

## 2014-01-28 MED ORDER — HYDROCODONE-ACETAMINOPHEN 5-325 MG PO TABS: 1.0000 | ORAL_TABLET | Freq: Four times a day (QID) | ORAL | Status: DC | PRN

## 2014-01-28 NOTE — Progress Notes (Signed)
Subjective: 63 year old man who continues to have problems with a chronic recurrent pain in his right knee. It was aspirated of 2 weeks ago and it is swollen back up since then. He continues to hurt. He takes some hydrocodone for it. It feels tight and uncomfortable. He has not seen an orthopedist recently for this. He did see someone about 6 months ago. It was injected in February and again in April. The last crystal analysis did not show any gout. He did have an elevated serum uric acid once earlier this year.  Objective: Pleasant gentleman who is pretty uncomfortable with his knee. Has large palpable effusion. No crepitance. Hard to straighten the knee out due to the pain. Discussed options.  Procedure: Using sterile technique and a lateral approach to the right knee about 63 cc of cloudy yellow fluid was aspirated without difficulty. Pressure was relieved. Decision was made not to reinject this time, a ride to go ahead and see an orthopedist.  Assessment: DJD DF knee with recurrent effusion  Plan: Will do one more crystal analysis to make sure there is no gout crystals seen. Make referral to orthopedics Continue pain medications in the meanwhile  I was unable to stand the order for his ferrous off vaccine through the computer, so wrote him a prescription for it.

## 2014-01-28 NOTE — Patient Instructions (Signed)
Apply ice to the knee several times daily  Take over-the-counter Aleve 2 pills twice daily if needed for pain and inflammation of the knee. Take with food.  Use the hydrocodone for severe pain  Referral we may to the orthopedic doctor  Take the prescription to your pharmacy to get your shingles vaccine.

## 2014-02-14 ENCOUNTER — Ambulatory Visit (INDEPENDENT_AMBULATORY_CARE_PROVIDER_SITE_OTHER): Payer: BC Managed Care – PPO | Admitting: Family Medicine

## 2014-02-14 ENCOUNTER — Ambulatory Visit (INDEPENDENT_AMBULATORY_CARE_PROVIDER_SITE_OTHER): Payer: BC Managed Care – PPO

## 2014-02-14 ENCOUNTER — Observation Stay (HOSPITAL_COMMUNITY)
Admission: EM | Admit: 2014-02-14 | Discharge: 2014-02-15 | Disposition: A | Discharge status: Home / Self Care | Payer: BC Managed Care – PPO | Attending: Internal Medicine | Admitting: Internal Medicine

## 2014-02-14 ENCOUNTER — Encounter (HOSPITAL_COMMUNITY): Payer: Self-pay | Admitting: Emergency Medicine

## 2014-02-14 VITALS — BP 134/72 | HR 104 | Temp 97.9°F | Resp 18 | Ht 68.0 in | Wt 195.0 lb

## 2014-02-14 DIAGNOSIS — R5381 Other malaise: Secondary | ICD-10-CM | POA: Insufficient documentation

## 2014-02-14 DIAGNOSIS — I209 Angina pectoris, unspecified: Secondary | ICD-10-CM

## 2014-02-14 DIAGNOSIS — R0602 Shortness of breath: Secondary | ICD-10-CM

## 2014-02-14 DIAGNOSIS — R079 Chest pain, unspecified: Secondary | ICD-10-CM

## 2014-02-14 DIAGNOSIS — R000 Tachycardia, unspecified: Secondary | ICD-10-CM | POA: Insufficient documentation

## 2014-02-14 DIAGNOSIS — I25119 Atherosclerotic heart disease of native coronary artery with unspecified angina pectoris: Secondary | ICD-10-CM

## 2014-02-14 DIAGNOSIS — R072 Precordial pain: Secondary | ICD-10-CM | POA: Diagnosis present

## 2014-02-14 DIAGNOSIS — R42 Dizziness and giddiness: Secondary | ICD-10-CM

## 2014-02-14 DIAGNOSIS — Z862 Personal history of diseases of the blood and blood-forming organs and certain disorders involving the immune mechanism: Secondary | ICD-10-CM | POA: Insufficient documentation

## 2014-02-14 DIAGNOSIS — E785 Hyperlipidemia, unspecified: Secondary | ICD-10-CM | POA: Diagnosis present

## 2014-02-14 DIAGNOSIS — I10 Essential (primary) hypertension: Secondary | ICD-10-CM | POA: Insufficient documentation

## 2014-02-14 DIAGNOSIS — E7800 Pure hypercholesterolemia, unspecified: Secondary | ICD-10-CM

## 2014-02-14 DIAGNOSIS — Z79899 Other long term (current) drug therapy: Secondary | ICD-10-CM | POA: Insufficient documentation

## 2014-02-14 DIAGNOSIS — I2510 Atherosclerotic heart disease of native coronary artery without angina pectoris: Secondary | ICD-10-CM | POA: Insufficient documentation

## 2014-02-14 DIAGNOSIS — M109 Gout, unspecified: Secondary | ICD-10-CM | POA: Insufficient documentation

## 2014-02-14 DIAGNOSIS — R5383 Other fatigue: Secondary | ICD-10-CM | POA: Insufficient documentation

## 2014-02-14 DIAGNOSIS — F17200 Nicotine dependence, unspecified, uncomplicated: Secondary | ICD-10-CM | POA: Insufficient documentation

## 2014-02-14 DIAGNOSIS — I25118 Atherosclerotic heart disease of native coronary artery with other forms of angina pectoris: Secondary | ICD-10-CM

## 2014-02-14 LAB — BASIC METABOLIC PANEL
ANION GAP: 15 (ref 5–15)
BUN: 11 mg/dL (ref 6–23)
CHLORIDE: 98 meq/L (ref 96–112)
CO2: 24 mEq/L (ref 19–32)
Calcium: 9.4 mg/dL (ref 8.4–10.5)
Creatinine, Ser: 0.74 mg/dL (ref 0.50–1.35)
GFR calc Af Amer: >90 mL/min (ref >90)
GFR calc non Af Amer: >90 mL/min (ref >90)
Glucose, Bld: 89 mg/dL (ref 70–99)
POTASSIUM: 4.4 meq/L (ref 3.7–5.3)
Sodium: 137 mEq/L (ref 137–147)

## 2014-02-14 LAB — POCT URINALYSIS DIPSTICK
Bilirubin, UA: NEGATIVE
Blood, UA: NEGATIVE
Glucose, UA: NEGATIVE
Ketones, UA: NEGATIVE
Leukocytes, UA: NEGATIVE
Nitrite, UA: NEGATIVE
Protein, UA: NEGATIVE
Spec Grav, UA: <=1.005
Urobilinogen, UA: 0.2
pH, UA: 5.5

## 2014-02-14 LAB — CBC
HCT: 45 % (ref 39.0–52.0)
Hemoglobin: 14.8 g/dL (ref 13.0–17.0)
MCH: 30 pg (ref 26.0–34.0)
MCHC: 32.9 g/dL (ref 30.0–36.0)
MCV: 91.3 fL (ref 78.0–100.0)
Platelets: 487 K/uL — ABNORMAL HIGH (ref 150–400)
RBC: 4.93 MIL/uL (ref 4.22–5.81)
RDW: 13.1 % (ref 11.5–15.5)
WBC: 11.7 K/uL — ABNORMAL HIGH (ref 4.0–10.5)

## 2014-02-14 LAB — POCT UA - MICROSCOPIC ONLY
Bacteria, U Microscopic: NEGATIVE
Casts, Ur, LPF, POC: NEGATIVE
Crystals, Ur, HPF, POC: NEGATIVE
Epithelial cells, urine per micros: NEGATIVE
Mucus, UA: NEGATIVE
RBC, urine, microscopic: NEGATIVE
WBC, Ur, HPF, POC: NEGATIVE
Yeast, UA: NEGATIVE

## 2014-02-14 LAB — TROPONIN I

## 2014-02-14 LAB — I-STAT TROPONIN, ED: Troponin i, poc: 0.01 ng/mL (ref 0.00–0.08)

## 2014-02-14 MED ORDER — LOSARTAN POTASSIUM 50 MG PO TABS
100.0000 mg | ORAL_TABLET | Freq: Every day | ORAL | Status: DC
  Administered 2014-02-14 – 2014-02-15 (×2): 100 mg via ORAL
  Filled 2014-02-14 (×3): qty 2

## 2014-02-14 MED ORDER — ONDANSETRON HCL 4 MG/2ML IJ SOLN
4.0000 mg | Freq: Once | INTRAMUSCULAR | Status: AC
  Administered 2014-02-14: 4 mg via INTRAVENOUS
  Filled 2014-02-14: qty 2

## 2014-02-14 MED ORDER — LOSARTAN POTASSIUM 50 MG PO TABS: 100.0000 mg | ORAL_TABLET | Freq: Every day | ORAL | Status: DC

## 2014-02-14 MED ORDER — HYDROCODONE-ACETAMINOPHEN 5-325 MG PO TABS: 1.0000 | ORAL_TABLET | Freq: Four times a day (QID) | ORAL | Status: DC | PRN

## 2014-02-14 MED ORDER — NITROGLYCERIN 0.4 MG SL SUBL
0.4000 mg | SUBLINGUAL_TABLET | SUBLINGUAL | Status: AC | PRN
  Administered 2014-02-14 (×3): 0.4 mg via SUBLINGUAL

## 2014-02-14 MED ORDER — COLCHICINE 0.6 MG PO TABS
1.2000 mg | ORAL_TABLET | Freq: Every day | ORAL | Status: DC
  Administered 2014-02-14 – 2014-02-15 (×2): 1.2 mg via ORAL
  Filled 2014-02-14 (×2): qty 2

## 2014-02-14 MED ORDER — HEPARIN SODIUM (PORCINE) 5000 UNIT/ML IJ SOLN
5000.0000 [IU] | Freq: Three times a day (TID) | INTRAMUSCULAR | Status: DC
  Filled 2014-02-14 (×3): qty 1

## 2014-02-14 MED ORDER — ASPIRIN EC 81 MG PO TBEC
81.0000 mg | DELAYED_RELEASE_TABLET | Freq: Every day | ORAL | Status: DC
  Administered 2014-02-15: 81 mg via ORAL
  Filled 2014-02-14: qty 1

## 2014-02-14 MED ORDER — ASPIRIN 81 MG PO CHEW: 324.0000 mg | CHEWABLE_TABLET | Freq: Once | ORAL | Status: DC

## 2014-02-14 MED ORDER — NITROGLYCERIN 0.4 MG SL SUBL
0.4000 mg | SUBLINGUAL_TABLET | SUBLINGUAL | Status: DC | PRN
  Administered 2014-02-15: 0.4 mg via SUBLINGUAL

## 2014-02-14 MED ORDER — ONDANSETRON HCL 4 MG/2ML IJ SOLN: 4.0000 mg | Freq: Four times a day (QID) | INTRAMUSCULAR | Status: DC | PRN

## 2014-02-14 MED ORDER — MORPHINE SULFATE 2 MG/ML IJ SOLN
2.0000 mg | Freq: Once | INTRAMUSCULAR | Status: AC
  Administered 2014-02-14: 2 mg via INTRAVENOUS
  Filled 2014-02-14: qty 1

## 2014-02-14 MED ORDER — ACETAMINOPHEN 325 MG PO TABS
650.0000 mg | ORAL_TABLET | ORAL | Status: DC | PRN
  Administered 2014-02-15: 650 mg via ORAL

## 2014-02-14 NOTE — ED Notes (Signed)
Pt. Stated, I started getting weak on Sat. And some chest pain.  I went home and just took it easy for the rest of the weekend.  I went to [facility] and he sent me here.

## 2014-02-14 NOTE — Telephone Encounter (Signed)
Rose--please contact the pt and schedule first available appointment with Dr Excell Seltzer.  The pt can have enough refills to last until this appointment.  If the pt does not make an appointment then his PCP will need to fill this medication.  Thank you,  Leotis Shames

## 2014-02-14 NOTE — ED Notes (Signed)
Cardiology at bedside.

## 2014-02-14 NOTE — H&P (Signed)
Pt. Seen and examined. Agree with the NP/PA-C note as written.  63 yo male with known CAD and long 60% LAD lesion and a 50% ostial D2 lesion at last cath in 07/2011.  He did notice chest pressure with activity however the symptoms generally went away after about 4 hours and he took 3 sublingual nitroglycerins. I agree with the concern for possible angina. His first troponin was negative. I would recommend admission to rule out acute coronary syndrome. If he is cardiac enzymes are positive I would recommend cardiac catheterization, if they're negative I would recommend a nuclear stress test tomorrow.  Chrystie Nose, MD, [facility] Attending Cardiologist [facility]

## 2014-02-14 NOTE — Progress Notes (Signed)
Chief Complaint:  Chief Complaint  Patient presents with  . Dizziness    felt like he was going to passout   . Tachycardia  . Shortness of Breath    HPI: [name] is a 63 y.o. male  With a PMH of CAD on meds except for today who is here for intermittent midsubsternal CP which started 3 days ago on Saturday while walking outside.  He was  casually walking without excessive exertion but did not feel good when this happened. The CP eventually  went away. He has had associated intermittent SOB and palpitations. He has been feeling nauseated when he eats. He had some loose stools on Friday and took Pepto which resolved.  He has felt  somewhat clammy and his legs felt like they have been tingling and swelling since this all started .   Feels like he has tightness in the middle of his chest , still has it, radiates to the back and feels like someone has punched him. 7/10 pain.  He  has had poor appetite since Saturday. So has not been eating or drinking well.  He deneis acid reflux. He has had no HAs or vision changes, no asymmetrica weakness, numbness or tingling.  He feels light headed, he has not taken any aspirin today, he has 7/10 pain.  Since Sunday he has been drinking gingerale and peanut butter crackers about 3 hours ago was last PO and has not taken any ASA. Feels nauseated when he eats. No diarrhea but again He had some loose stools and took pepto on Saturday x 1. His stools are normal now. He has no URI sxs, chronic wheezing like he normally does. He denies coughing, no pedal edema, weight gain, orthopnea or PND.  He is currently by himself at home , taking care of his son's dogs.     Past Medical History  Diagnosis Date  . CAD (coronary artery disease)     LHC 1/08: mLAD 30-50%, mCFX 30%, left PDA 30%,  EF 55%;    Myoview  07/31/11: EF 54%, no scar or ischemia, + chest pain, + hypotensive response  . Hypertension   . Hyperlipidemia   . Chest pain   . Leukocytosis   .  Gout    Past Surgical History  Procedure Laterality Date  . Appendectomy    . Hemorrhoid surgery     History   Social History  . Marital Status: Single    Spouse Name: N/A    Number of Children: N/A  . Years of Education: N/A   Social History Main Topics  . Smoking status: Current Every Day Smoker -- 1.00 packs/day for 45 years    Types: Cigarettes  . Smokeless tobacco: Never Used  . Alcohol Use: No  . Drug Use: No  . Sexual Activity: None   Other Topics Concern  . None   Social History Narrative  . None   Family History  Problem Relation Age of Onset  . Coronary artery disease      Father with MI in his 6550s  . Heart attack Father   . Cancer Brother     lung  . Heart attack Paternal Grandfather   . Stroke Brother   . Coronary artery disease Brother   . Heart attack Brother    No Known Allergies Prior to Admission medications   Medication Sig Start Date End Date Taking? Authorizing Provider  amLODipine (NORVASC) 5 MG tablet Take 1 tablet (5  mg total) by mouth daily. 11/16/12  Yes Phillips Odor, MD  HYDROcodone-acetaminophen (NORCO) 5-325 MG per tablet Take 1 tablet by mouth every 6 (six) hours as needed for moderate pain. 01/28/14  Yes Peyton Najjar, MD  losartan (COZAAR) 100 MG tablet Take 1 tablet (100 mg total) by mouth daily. 01/20/14 11/08/15 Yes Tonny Bollman, MD  nitroGLYCERIN (NITROSTAT) 0.4 MG SL tablet Place 0.4 mg under the tongue every 5 (five) minutes as needed for chest pain.   Yes Historical Provider, MD  aspirin EC 81 MG tablet Take 81 mg by mouth daily.    Historical Provider, MD  colchicine 0.6 MG tablet 2 now and one in one hour.  Tomorrow one BID 12/06/13   Phillips Odor, MD  loperamide (IMODIUM A-D) 2 MG tablet 2 now and one hourly prn diarrhea.  Max 8 tabs in 24 hours 12/09/13   Phillips Odor, MD  nebivolol (BYSTOLIC) 10 MG tablet Take 1 tablet (10 mg total) by mouth daily. 06/22/13   Nyoka Cowden, MD  ondansetron (ZOFRAN-ODT) 8 MG  disintegrating tablet Take 1 tablet (8 mg total) by mouth every 8 (eight) hours as needed for nausea. 12/09/13   Phillips Odor, MD  predniSONE (DELTASONE) 20 MG tablet Take 3 pills daily for 2 days, then 2 daily for 2 days, then one daily for 2 days for gout 01/10/14   Karington Zarazua P Dontel Harshberger, DO  promethazine-codeine (PHENERGAN WITH CODEINE) 6.25-10 MG/5ML syrup Take 5-10 mLs by mouth every 6 (six) hours as needed. 12/06/13   Phillips Odor, MD     ROS: The patient denies fevers, chills, night sweats, unintentional weight loss, vomiting, abdominal pain, dysuria, hematuria, melena, numbness  All other systems have been reviewed and were otherwise negative with the exception of those mentioned in the HPI and as above.    PHYSICAL EXAM: Filed Vitals:   02/14/14 1202  BP: 134/72  Pulse: 104  Temp: 97.9 F (36.6 C)  Resp: 18  Spo2 96% Filed Vitals:   02/14/14 1202  Height: 5\' 8"  (1.727 m)  Weight: 195 lb (88.451 kg)   Body mass index is 29.66 kg/(m^2).  General: Alert, no acute distress HEENT:  Normocephalic, atraumatic, oropharynx patent. EOMI, PERRLA Cardiovascular:  Sinus tach, no rubs murmurs or gallops.  No Carotid bruits, radial pulse intact. No pedal edema.  Respiratory: Clear to auscultation bilaterally.  + minimal basilar expiratory wheezes that is typical for him, rales, or rhonchi.  No cyanosis, no use of accessory musculature GI: No organomegaly, abdomen is soft and non-tender, positive bowel sounds.  + ventral hernia Skin: No rashes. Neurologic: Facial musculature symmetric. Psychiatric: Patient is appropriate throughout our interaction. Lymphatic: No cervical lymphadenopathy Musculoskeletal: Gait is antalgic.   LABS: Results for orders placed in visit on 02/14/14  POCT URINALYSIS DIPSTICK      Result Value Ref Range   Color, UA yellow     Clarity, UA clear     Glucose, UA neg     Bilirubin, UA neg     Ketones, UA neg     Spec Grav, UA <=1.005     Blood, UA neg     pH,  UA 5.5     Protein, UA neg     Urobilinogen, UA 0.2     Nitrite, UA neg     Leukocytes, UA Negative    POCT UA - MICROSCOPIC ONLY      Result Value Ref Range   WBC, Ur, HPF, POC neg  RBC, urine, microscopic neg     Bacteria, U Microscopic neg     Mucus, UA neg     Epithelial cells, urine per micros neg     Crystals, Ur, HPF, POC neg     Casts, Ur, LPF, POC neg     Yeast, UA neg       EKG/XRAY:   Primary read interpreted by Dr. Conley Rolls at [facility]. No acute cardiopulmonary changes when compared to 10/2011 xray  EKG shows sinus tach at 104 without ST depression or elevation,   ASSESSMENT/PLAN: Encounter Diagnoses  Name Primary?  . SOB (shortness of breath)   . Dizziness and giddiness   . Chest pain, unspecified chest pain type Yes  . Tachycardia    63 y/o male with a PMH of tobacco use, CAD with diffuse LAD stenosis on meds who is supposedly  followed by Dr Excell Seltzer ( but per EMR last seen in 2013) , HTN, hyperlipidemia, and a strong family hx of heart disease who presents with intermittent CP and SOB, dizziness and nausea  since Friday.  Will send to ER for further eval of what sounds like anginal symptoms. [facility] ER triage nurse notified.  He was given ASA 325 mg today He declines to go by ambulance due to financial reasons with [facility], risk and benefits explained to him, but he declined to go by ER. States he will be driven by his friend.  F/u prn  Gross sideeffects, risk and benefits, and alternatives of medications d/w patient. Patient is aware that all medications have potential sideeffects and we are unable to predict every sideeffect or drug-drug interaction that may occur.  Leota Maka PHUONG, DO 02/14/2014 1:00 PM

## 2014-02-14 NOTE — ED Provider Notes (Signed)
CSN: [id number]     Arrival date & time 02/14/14  1336 History   First MD Initiated Contact with Patient 02/14/14 1401     Chief Complaint  Patient presents with  . Weakness  . Chest Pain     (Consider location/radiation/quality/duration/timing/severity/associated sxs/prior Treatment) HPI Cardiologist: Dr. Excell Seltzer  Patient with multiple cardiac risk factors for heart disease such as CAD (pt reports hx of cardiac catheterizations), family hx of heart disease, smoker, hypertension, hyperlipidemia, with complaints of chest pains on and off since Saturday. He says on Saturday he had some chest pressure and a fast heart rate, he relaxed and it resolved on its own by Sunday morning. 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 He left work and went to [facility] to see his PCP, Dr. Conley Rolls who referred him to the ED for further evaluation. He did not take any medication for the pain prior to arrival. His symptoms have improved but he still feels a heavy pressure on his chest.   LHC 1/08: mLAD 30-50%, mCFX 30%, left PDA 30%, EF 55%; Myoview 07/31/11: EF 54%, no scar or ischemia, + chest pain, + hypotensive response     Past Medical History  Diagnosis Date  . CAD (coronary artery disease)     LHC 1/08: mLAD 30-50%, mCFX 30%, left PDA 30%,  EF 55%;    Myoview  07/31/11: EF 54%, no scar or ischemia, + chest pain, + hypotensive response  . Hypertension   . Hyperlipidemia   . Chest pain   . Leukocytosis   . Gout    Past Surgical History  Procedure Laterality Date  . Appendectomy    . Hemorrhoid surgery    . Angioplasty     Family History  Problem Relation Age of Onset  . Coronary artery disease      Father with MI in his 72s  . Heart attack Father   . Cancer Brother     lung  . Heart attack Paternal Grandfather   . Stroke Brother   . Coronary artery  disease Brother   . Heart attack Brother    History  Substance Use Topics  . Smoking status: Current Every Day Smoker -- 1.00 packs/day for 45 years    Types: Cigarettes  . Smokeless tobacco: Never Used  . Alcohol Use: No    Review of Systems   Review of Systems  Gen: no weight loss, fevers, chills, night sweats  Eyes: no discharge or drainage, no occular pain or visual changes  Nose: no epistaxis or rhinorrhea  Mouth: no dental pain, no sore throat  Neck: no neck pain  Lungs:No wheezing or hemoptysis No coughing + SOB ( resolved) CV:  No palpitations, dependent edema or orthopnea. + chest pain Abd: no diarrhea. No nausea or vomiting, No abdominal pain  GU: no dysuria or gross hematuria  MSK:  No muscle weakness, No  pain Neuro: no headache, no focal neurologic deficits + global weakness (mild) Skin: no rash , no wounds Psyche: no complaints    Allergies  Review of patient's allergies indicates no known allergies.  Home Medications   Prior to Admission medications   Medication Sig Start Date End Date Taking? Authorizing Provider  colchicine 0.6 MG tablet Take 1.2 mg by mouth daily.   Yes Historical Provider, MD  HYDROcodone-acetaminophen (NORCO) 5-325 MG  per tablet Take 1 tablet by mouth every 6 (six) hours as needed for moderate pain. 01/28/14  Yes Peyton Najjar, MD  losartan (COZAAR) 100 MG tablet Take 1 tablet (100 mg total) by mouth daily. 01/20/14 11/08/15 Yes Tonny Bollman, MD  nitroGLYCERIN (NITROSTAT) 0.4 MG SL tablet Place 0.4 mg under the tongue every 5 (five) minutes as needed for chest pain.    Historical Provider, MD   BP 127/83  Pulse 92  Temp(Src) 98.2 F (36.8 C) (Oral)  Resp 18  Ht 5\' 8"  (1.727 m)  Wt 198 lb (89.812 kg)  BMI 30.11 kg/m2  SpO2 92% Physical Exam  Nursing note and vitals reviewed. Constitutional: He appears well-developed and well-nourished. No distress.  HENT:  Head: Normocephalic and atraumatic.  Eyes: Pupils are equal, round,  and reactive to light.  Neck: Normal range of motion. Neck supple.  Cardiovascular: Regular rhythm.  Tachycardia present.   Pulmonary/Chest: Effort normal. No respiratory distress. He has no wheezes. He exhibits no tenderness.  Abdominal: Soft.  Neurological: He is alert.  Skin: Skin is warm and dry.    ED Course  Procedures (including critical care time) Labs Review Labs Reviewed  CBC - Abnormal; Notable for the following:    WBC 11.7 (*)    Platelets 487 (*)    All other components within normal limits  BASIC METABOLIC PANEL  I-STAT TROPOININ, ED    Imaging Review Dg Chest 2 View  02/14/2014   CLINICAL DATA:  Shortness of Breath  EXAM: CHEST  2 VIEW  COMPARISON:  11/01/2013  FINDINGS: Cardiomediastinal silhouette is stable. No acute infiltrate or pleural effusion. No pulmonary edema. Mild degenerative changes thoracic spine.  IMPRESSION: No active cardiopulmonary disease.   Electronically Signed   By: 11/03/2013 M.D.   On: 02/14/2014 13:17    CLINICAL DATA: Shortness of Breath  EXAM:  CHEST 2 VIEW  COMPARISON: 11/01/2013  FINDINGS:  Cardiomediastinal silhouette is stable. No acute infiltrate or  pleural effusion. No pulmonary edema. Mild degenerative changes  thoracic spine.  IMPRESSION:  No active cardiopulmonary disease.  Electronically Signed  By: 11/03/2013 M.D.  On: 02/14/2014 13:17    MDM   Final diagnoses:  Chest pain, unspecified chest pain type   2: 36 pm Patient had a nonobstructive cath in 2012, no hx of stent placement that I can find in his chart.   Chest xray, Trop are pending.  3: 00 pm The patients first Troponin is negative. His chst xray done at [facility] is also unremarkable. He last had a cath done in 2008 and last saw Dr. 2009 approx 1 year ago. Dr. Excell Seltzer recommends calling cardiology since he is an established patient for consultation in the ED with further recommendations.   3:06 pm At end of shift, the patient will be handed off to  oncoming PA-C, Fonnie Jarvis.   Sharilyn Sites, PA-C 02/14/14 [phone]

## 2014-02-14 NOTE — H&P (Signed)
Patient ID: [name] MRN: [id number], DOB/AGE: Dec 04, 1950   Admit date: 02/14/2014   Primary Physician: Pcp Not In System Primary Cardiologist: Dr. Excell Seltzer (has not seen since Oct 2013)  Pt. Profile:  62 year old Caucasian male with history of hypertension, hyperlipidemia, coronary artery disease, tobacco abuse and early family history of MI presented with his chest pressure relieved with nitroglycerin   Problem List  Past Medical History  Diagnosis Date  . CAD (coronary artery disease)     LHC 1/08: mLAD 30-50%, mCFX 30%, left PDA 30%,  EF 55%;    Myoview  07/31/11: EF 54%, no scar or ischemia, + chest pain, + hypotensive response  . Hypertension   . Hyperlipidemia   . Chest pain   . Leukocytosis   . Gout     Past Surgical History  Procedure Laterality Date  . Appendectomy    . Hemorrhoid surgery    . Angioplasty       Allergies  No Known Allergies  HPI  The patient is a 63 year old Caucasian male with past medical history of hypertension, hyperlipidemia, tobacco abuse, significant family history of early MI and history of coronary artery disease status post 3 cardiac catheterization however no stent. He had the original cardiac catheterization in 1996, followed by a second cardiac catheterization in January 2008 which showed 30-50% mid LAD stenosis, 50% mid circumflex stenosis, 30% left PDA stenosis, EF 55%. He underwent Myoview in January 2013 which showed EF 54%, no scar or ischemia, however he did have chest pain and hypertensive response during the stress test. He underwent cardiac catheterization on 08/12/2011 which showed EF 55%, 60% long segment LAD stenosis, 50% ostial D2 stenosis, and left circumflex artery is the dominant vessel with mild disease, and no significant disease in small nondominant RCA. The long lesion in the mid and distal LAD was not optimal vessel for PCI. Given the fact that the patient just had a negative Myoview scan, he was placed on  medical therapy. His last visit with Dr. Excell Seltzer was in October 2013 at that time he did not have any significant anginal symptoms. He was also noted to have cut back on smoking as well.  According to the patient, he has not been very active. 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 He has some ipsilateral swelling in the right lower extremity after starting using the knee brace on the right. He denies any recent fever, chill, or significant shortness of breath. Patient states he did not feel well last Friday on July 24. On Saturday morning, while sitting in a chair doing nothin, patient had sudden onset of palpitation with nausea and dizziness. The symptom resolved after roughly 10 minutes without treatment. He did not feel that well on Sunday either. 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 This new episode was not accompanied by palpitation feeling and was mainly chest pressure. The chest discomfort eventually went away after 4 hours and 3 sublingual nitroglycerins.  On arrival to [facility] ED, his blood pressure was 138/80. He was noted to be tachycardic with heart rate in the 90s to 100s. O2 saturation 94% on room air. Other laboratory findings include white blood cell count mildly elevated at 11.7, troponin negative. He also had negative analysis. Cardiology was consulted for chest pain.   Home Medications  Prior to Admission medications   Medication Sig Start  Date End Date Taking? Authorizing Provider  colchicine 0.6 MG tablet Take 1.2 mg by mouth daily.   Yes Historical Provider, MD  HYDROcodone-acetaminophen (NORCO) 5-325 MG per tablet Take 1 tablet by mouth every 6 (six) hours as needed for moderate pain. 01/28/14  Yes Peyton Najjar, MD  losartan (COZAAR) 100 MG tablet Take 1 tablet (100 mg total) by mouth daily. 01/20/14 11/08/15 Yes Tonny Bollman, MD    nitroGLYCERIN (NITROSTAT) 0.4 MG SL tablet Place 0.4 mg under the tongue every 5 (five) minutes as needed for chest pain.    Historical Provider, MD    Family History  Family History  Problem Relation Age of Onset  . Coronary artery disease      Father with MI in his 25s  . Heart attack Father   . Cancer Brother     lung  . Heart attack Paternal Grandfather   . Stroke Brother   . Coronary artery disease Brother   . Heart attack Brother     Social History  History   Social History  . Marital Status: Single    Spouse Name: N/A    Number of Children: N/A  . Years of Education: N/A   Occupational History  . Not on file.   Social History Main Topics  . Smoking status: Current Every Day Smoker -- 1.00 packs/day for 45 years    Types: Cigarettes  . Smokeless tobacco: Never Used  . Alcohol Use: No  . Drug Use: No  . Sexual Activity: Not on file   Other Topics Concern  . Not on file   Social History Narrative  . No narrative on file     Review of Systems General:  No chills, fever, night sweats or weight changes.  Cardiovascular:  No dyspnea on exertion, edema, orthopnea, paroxysmal nocturnal dyspnea. +CP, ipsilateral edema in RLE distal to knee braces. 1 episode of palpitation last Sat Dermatological: No rash, lesions/masses Respiratory: No cough, dyspnea Urologic: No hematuria, dysuria Abdominal:   No nausea, vomiting, diarrhea, melena, or hematemesis. Occasional blood when having bowel movement, small quantity, only seen on toilet tissue Neurologic:  No visual changes, changes in mental status. +weakness All other systems reviewed and are otherwise negative except as noted above.  Physical Exam  Blood pressure 141/77, pulse 92, temperature 98.2 F (36.8 C), temperature source Oral, resp. rate 18, height 5\' 8"  (1.727 m), weight 198 lb (89.812 kg), SpO2 96.00%.  General: Pleasant, NAD Psych: Normal affect. Neuro: Alert and oriented X 3. Moves all extremities  spontaneously. HEENT: Normal  Neck: Supple without bruits or JVD. Lungs:  Resp regular and unlabored, bilateral rhonchi with expiratory  Heart: tachycardic no s3, s4, or murmurs. Abdomen: Soft, non-tender, non-distended, BS + x 4.  Extremities: No clubbing, cyanosis. DP/PT/Radials 2+ and equal bilaterally. 1+ pitting edema in RLE distal to knee braces, no edema in LLE.  Labs  Troponin [facility] Test)  Recent Labs  02/14/14 1427  TROPIPOC 0.01   No results found for this basename: CKTOTAL, CKMB, TROPONINI,  in the last 72 hours Lab Results  Component Value Date   WBC 11.7* 02/14/2014   HGB 14.8 02/14/2014   HCT 45.0 02/14/2014   MCV 91.3 02/14/2014   PLT 487* 02/14/2014    Recent Labs Lab 02/14/14 1416  NA 137  K 4.4  CL 98  CO2 24  BUN 11  CREATININE 0.74  CALCIUM 9.4  GLUCOSE 89   Lab Results  Component Value Date  CHOL 206* 05/02/2012   HDL 46 05/02/2012   LDLCALC 146* 05/02/2012   TRIG 72 05/02/2012   No results found for this basename: DDIMER     Radiology/Studies  Dg Chest 2 View  02/14/2014   CLINICAL DATA:  Shortness of Breath  EXAM: CHEST  2 VIEW  COMPARISON:  11/01/2013  FINDINGS: Cardiomediastinal silhouette is stable. No acute infiltrate or pleural effusion. No pulmonary edema. Mild degenerative changes thoracic spine.  IMPRESSION: No active cardiopulmonary disease.   Electronically Signed   By: Natasha Mead M.D.   On: 02/14/2014 13:17    ECG  No EKG recorded in the system  ASSESSMENT AND PLAN  1. Chest pressure  - trend troponin overnight, if negative stress test in am. If positive, plan for cath  - NPO past midnight  2. Palpitation   - appears to be separate, monitor for arrythmia  3. H/o CAD with 60% long mid to distal LAD residual, however no prior h/o PCI  - cath 1996  - cath 07/2006 EF 55%, 30-50% mid LAD stenosis, 50% mid circumflex stenosis, 30% left PDA stenosis.  - cath 07/2011 EF 55%, 60% long segment LAD stenosis, 50% ostial D2  stenosis, and left circumflex artery is the dominant vessel with mild disease, and no significant disease in small nondominant RCA  4. Hypertension 5. Hyperlipidemia 6. Tobacco abuse, cutting back down to 1/3 ppd  - advised on tobacco cessation  7. Tachycardia - continue to monitor  Signed, Azalee Course, PA-C 02/14/2014, 4:10 PM

## 2014-02-14 NOTE — ED Provider Notes (Signed)
ECG sinus tachycardia, ventricular rate 108, incomplete right arm or jaw, compared with April 2014 rate now faster  Medical screening examination/treatment/procedure(s) were conducted as a shared visit with non-physician practitioner(s) and myself.  I personally evaluated the patient during the encounter.   EKG Interpretation None     Muse not working; see ECG interpretation noted above  Patient currently pain-free the emergency department but had a few hours of vague chest discomfort with nausea today after having several hours of vague diffuse abdominal pain and nausea yesterday and 2 days ago had a couple hours of vague chest discomfort as well.    Hurman Horn, MD 02/14/14 2211

## 2014-02-14 NOTE — Patient Instructions (Signed)

## 2014-02-15 ENCOUNTER — Observation Stay (HOSPITAL_COMMUNITY): Payer: BC Managed Care – PPO

## 2014-02-15 DIAGNOSIS — I2510 Atherosclerotic heart disease of native coronary artery without angina pectoris: Secondary | ICD-10-CM

## 2014-02-15 DIAGNOSIS — R072 Precordial pain: Secondary | ICD-10-CM

## 2014-02-15 LAB — LIPID PANEL
Cholesterol: 218 mg/dL — ABNORMAL HIGH (ref 0–200)
HDL: 43 mg/dL (ref >39)
LDL CALC: 151 mg/dL — AB (ref 0–99)
Total CHOL/HDL Ratio: 5.1 RATIO
Triglycerides: 118 mg/dL (ref <150)
VLDL: 24 mg/dL (ref 0–40)

## 2014-02-15 LAB — TROPONIN I
Troponin I: <0.3 ng/mL (ref <0.30)
Troponin I: <0.3 ng/mL (ref <0.30)

## 2014-02-15 MED ORDER — COLCHICINE 0.6 MG PO TABS: 1.2000 mg | ORAL_TABLET | Freq: Every day | ORAL | Status: DC

## 2014-02-15 MED ORDER — ATORVASTATIN CALCIUM 40 MG PO TABS
40.0000 mg | ORAL_TABLET | Freq: Every day | ORAL | Status: DC
  Filled 2014-02-15: qty 1

## 2014-02-15 MED ORDER — ATORVASTATIN CALCIUM 40 MG PO TABS: 40.0000 mg | ORAL_TABLET | Freq: Every day | ORAL | Status: DC

## 2014-02-15 MED ORDER — NITROGLYCERIN 0.4 MG SL SUBL
0.4000 mg | SUBLINGUAL_TABLET | SUBLINGUAL | Status: DC | PRN
  Administered 2014-02-15: 0.4 mg via SUBLINGUAL

## 2014-02-15 MED ORDER — TECHNETIUM TC 99M SESTAMIBI GENERIC - CARDIOLITE
10.0000 | Freq: Once | INTRAVENOUS | Status: AC | PRN
  Administered 2014-02-15: 10 via INTRAVENOUS

## 2014-02-15 MED ORDER — LOSARTAN POTASSIUM 100 MG PO TABS: 100.0000 mg | ORAL_TABLET | Freq: Every day | ORAL | Status: DC

## 2014-02-15 MED ORDER — ACETAMINOPHEN 325 MG PO TABS
ORAL_TABLET | ORAL | Status: AC
  Filled 2014-02-15: qty 1

## 2014-02-15 MED ORDER — TECHNETIUM TC 99M SESTAMIBI GENERIC - CARDIOLITE
30.0000 | Freq: Once | INTRAVENOUS | Status: AC | PRN
  Administered 2014-02-15: 30 via INTRAVENOUS

## 2014-02-15 MED ORDER — ASPIRIN 81 MG PO TBEC: 81.0000 mg | DELAYED_RELEASE_TABLET | Freq: Every day | ORAL | Status: AC

## 2014-02-15 MED ORDER — NITROGLYCERIN 0.4 MG SL SUBL
SUBLINGUAL_TABLET | SUBLINGUAL | Status: AC
  Administered 2014-02-15: 0.4 mg via SUBLINGUAL
  Filled 2014-02-15: qty 1

## 2014-02-15 MED ORDER — REGADENOSON 0.4 MG/5ML IV SOLN
0.4000 mg | Freq: Once | INTRAVENOUS | Status: AC
  Administered 2014-02-15: 0.4 mg via INTRAVENOUS

## 2014-02-15 MED ORDER — REGADENOSON 0.4 MG/5ML IV SOLN
INTRAVENOUS | Status: AC
  Filled 2014-02-15: qty 5

## 2014-02-15 NOTE — Progress Notes (Signed)
Subjective: No chest pain currently.  Objective: Vital signs in last 24 hours: Temp:  [97.9 F (36.6 C)-98.5 F (36.9 C)] 98.5 F (36.9 C) (07/28 0339) Pulse Rate:  [80-105] 80 (07/28 0339) Resp:  [15-29] 18 (07/28 0339) BP: (127-159)/(72-91) 148/75 mmHg (07/28 1019) SpO2:  [86 %-96 %] 86 % (07/28 1019) Weight:  [191 lb 4.8 oz (86.773 kg)-198 lb (89.812 kg)] 191 lb 4.8 oz (86.773 kg) (07/28 0339) Last BM Date: 02/14/14  Intake/Output from previous day:   Intake/Output this shift:    Medications Current Facility-Administered Medications  Medication Dose Route Frequency Provider Last Rate Last Dose  . acetaminophen (TYLENOL) tablet 650 mg  650 mg Oral Q4H PRN Azalee Course, PA      . aspirin EC tablet 81 mg  81 mg Oral Daily Azalee Course, [place]      . colchicine tablet 1.2 mg  1.2 mg Oral Daily Azalee Course, PA   1.2 mg at 02/14/14 2135  . heparin injection 5,000 Units  5,000 Units Subcutaneous 3 times per day Azalee Course, PA      . HYDROcodone-acetaminophen (NORCO/VICODIN) 5-325 MG per tablet 1 tablet  1 tablet Oral Q6H PRN Azalee Course, PA      . losartan (COZAAR) tablet 100 mg  100 mg Oral Daily Chrystie Nose, MD   100 mg at 02/14/14 2135  . nitroGLYCERIN (NITROSTAT) SL tablet 0.4 mg  0.4 mg Sublingual Q5 Min x 3 PRN Azalee Course, PA      . ondansetron (ZOFRAN) injection 4 mg  4 mg Intravenous Q6H PRN Azalee Course, PA      . regadenoson (LEXISCAN) 0.4 MG/5ML injection SOLN             PE: General appearance: alert, cooperative and no distress Lungs: Mild diffuse wheezing Heart: regular rate and rhythm, S1, S2 normal, no murmur, click, rub or gallop Extremities: No LEE Pulses: 2+ and symmetric Skin: Warm and dry Neurologic: Grossly normal  Lab Results:   Recent Labs  02/14/14 1416  WBC 11.7*  HGB 14.8  HCT 45.0  PLT 487*   BMET  Recent Labs  02/14/14 1416  NA 137  K 4.4  CL 98  CO2 24  GLUCOSE 89  BUN 11  CREATININE 0.74  CALCIUM 9.4   PT/INR No results found for this  basename: LABPROT, INR,  in the last 72 hours Cholesterol  Recent Labs  02/15/14 0326  CHOL 218*   Lipid Panel     Component Value Date/Time   CHOL 218* 02/15/2014 0326   TRIG 118 02/15/2014 0326   HDL 43 02/15/2014 0326   CHOLHDL 5.1 02/15/2014 0326   VLDL 24 02/15/2014 0326   LDLCALC 151* 02/15/2014 0326   Cardiac Panel (last 3 results)  Recent Labs  02/14/14 2030 02/15/14 0326 02/15/14 0854  TROPONINI <0.30 <0.30 <0.30     Assessment/Plan  Chest pressure  Ruled out for MI Lexiscan today:  Developed 10/10 chest tightness during the test.  Decreased with 2 SL NTG and O2 at 2L/min.  No EKG changes noted other than increase to sinus tach.  Palpitation    appears to be separate, monitor for arrythmia  CAD  60% long mid to distal LAD residual, however no prior h/o PCI  - cath 1996  - cath 07/2006 EF 55%, 30-50% mid LAD stenosis, 50% mid circumflex stenosis, 30% left PDA stenosis.  - cath 07/2011 EF 55%, 60% long segment LAD stenosis, 50% ostial D2 stenosis, and  left circumflex artery is the dominant vessel with mild disease, and no significant disease in small nondominant RCA  4. Hypertension   BP stable Cozaar100 5. Hyperlipidemia   Uncontrolled.  Start lipitor 40mg  daily. 6. Tobacco abuse, cutting back down to 1/3 ppd   advised on tobacco cessation    LOS: 1 day     [name], [name] 02/15/2014 10:32 AM  I have examined the patient and reviewed assessment and plan and discussed with patient.  Agree with above as stated.  Await results of stress test to see if further ischemia w/u is warranted.  [name].

## 2014-02-15 NOTE — Discharge Summary (Signed)
CARDIOLOGY DISCHARGE SUMMARY   Patient ID: [name] MRN: [id number] DOB/AGE: 1951-04-30 63 y.o.  Admit date: 02/14/2014 Discharge date: 02/15/2014  PCP: Pcp Not In System Primary Cardiologist: Dr Excell Seltzer in 2013  Primary Discharge Diagnosis:  Precordial pain Secondary Discharge Diagnosis:    HTN (hypertension)   HLD (hyperlipidemia)   Tobacco use  Procedures: Lexi scan nuclear stress test  Hospital Course: [name] is a 63 y.o. male with a history of nonobstructive CAD, last heart catheterization in 2013 with 60% LAD, 50% D2. EF 55%. He had chest pressure and came to the hospital where he was admitted for further evaluation and treatment.  His cardiac enzymes were negative for MI. He had a Lexi scan nuclear stress test. He developed 10/10 chest tightness during the test. This was treated with sublingual nitroglycerin x 2 and oxygen. He had no acute ischemic ECG changes.   The report from the stress test as below it showed no scar or ischemia and an EF of 56%. A lipid profile is below. He has hyperlipidemia with a significantly elevated LDL. He was started on Lipitor 40 mg daily for this. He was continued on his home medication for hypertension, Cozaar 100 mg daily and is encouraged to continued this consistently. Smoking cessation was discussed. The patient advised [place] he had cut down to 1/3 packs per day, but is encouraged to quit completely.   on 07/28, he was seen by Dr. Abe People and all data were reviewed. No further inpatient workup is indicated and he is considered stable for discharge, to follow up as an outpatient. He will be given prescriptions for Cozaar, Lipitor and a short-term prescription for colchicine. A message has been sent to the office to arrange a followup appointment for Dr. Excell Seltzer or an extender.  Labs:   Lab Results  Component Value Date   WBC 11.7* 02/14/2014   HGB 14.8 02/14/2014   HCT 45.0 02/14/2014   MCV 91.3 02/14/2014   PLT 487*  02/14/2014     Recent Labs Lab 02/14/14 1416  NA 137  K 4.4  CL 98  CO2 24  BUN 11  CREATININE 0.74  CALCIUM 9.4  GLUCOSE 89    Recent Labs  02/14/14 2030 02/15/14 0326 02/15/14 0854  TROPONINI <0.30 <0.30 <0.30   Lipid Panel     Component Value Date/Time   CHOL 218* 02/15/2014 0326   TRIG 118 02/15/2014 0326   HDL 43 02/15/2014 0326   CHOLHDL 5.1 02/15/2014 0326   VLDL 24 02/15/2014 0326   LDLCALC 151* 02/15/2014 0326    Pro B Natriuretic peptide (BNP)  Date/Time Value Ref Range Status  06/22/2013  5:13 PM 76.0  0.0 - 100.0 pg/mL Final  11/15/2012  1:56 PM 51.5  0 - 125 pg/mL Final      Radiology: Dg Chest 2 View 02/14/2014   CLINICAL DATA:  Shortness of Breath  EXAM: CHEST  2 VIEW  COMPARISON:  11/01/2013  FINDINGS: Cardiomediastinal silhouette is stable. No acute infiltrate or pleural effusion. No pulmonary edema. Mild degenerative changes thoracic spine.  IMPRESSION: No active cardiopulmonary disease.   Electronically Signed   By: Natasha Mead M.D.   On: 02/14/2014 13:17   Nm Myocar Multi W/spect W/wall Motion / Ef 02/15/2014   CLINICAL DATA:  The patient has had chest pain. This study is done for further evaluation.  EXAM: MYOCARDIAL IMAGING WITH SPECT (REST AND PHARMACOLOGIC-STRESS)  GATED LEFT VENTRICULAR WALL MOTION STUDY  LEFT VENTRICULAR  EJECTION FRACTION  TECHNIQUE: Standard myocardial SPECT imaging was performed after resting intravenous injection of 10 mCi Tc-52m sestamibi. Subsequently, intravenous infusion of Lexiscan was performed under the supervision of the Cardiology staff. At peak effect of the drug, 30 mCi Tc-84m sestamibi was injected intravenously and standard myocardial SPECT imaging was performed. Quantitative gated imaging was also performed to evaluate left ventricular wall motion, and estimate left ventricular ejection fraction.  COMPARISON:  None.  FINDINGS: The patient was stressed with Lexiscan infusion. He had 10 over 10 chest pain during the infusion.  This resolved afterwards. There was no diagnostic EKG change. The raw data reveals no excess motion. Tomographic images with stress reveal normal uptake in all segments. Tomographic images at rest revealed normal uptake in all segments. There is no evidence of ischemia. Wall motion analysis reveals normal motion. The ejection fraction is 56%. The quantitative analysis raises the question of slight decreased activity new the apex. I believe that this is not a significant finding.  IMPRESSION: Overall this study reveals no significant abnormality. There is no scar or ischemia. There is normal wall motion.   Electronically Signed   By: Willa Rough   On: 02/15/2014 15:31   EKG: 02/15/2014 Vent. rate 89 BPM PR interval 168 ms QRS duration 96 ms QT/QTc 388/472 ms P-R-T axes 62 56 55   FOLLOW UP PLANS AND APPOINTMENTS No Known Allergies   Medication List         aspirin 81 MG EC tablet  Take 1 tablet (81 mg total) by mouth daily.     atorvastatin 40 MG tablet  Commonly known as:  LIPITOR  Take 1 tablet (40 mg total) by mouth daily at 6 PM.     colchicine 0.6 MG tablet  Take 2 tablets (1.2 mg total) by mouth daily.     HYDROcodone-acetaminophen 5-325 MG per tablet  Commonly known as:  NORCO  Take 1 tablet by mouth every 6 (six) hours as needed for moderate pain.     losartan 100 MG tablet  Commonly known as:  COZAAR  Take 1 tablet (100 mg total) by mouth daily.     nitroGLYCERIN 0.4 MG SL tablet  Commonly known as:  NITROSTAT  Place 0.4 mg under the tongue every 5 (five) minutes as needed for chest pain.        Discharge Instructions   Diet - low sodium heart healthy    Complete by:  As directed      Increase activity slowly    Complete by:  As directed           Follow-up Information   Follow up with Tonny Bollman, MD. (The office will call.)    Specialty:  Cardiology   Contact information:   [address] [place] [address] [phone]        BRING ALL MEDICATIONS WITH YOU TO FOLLOW UP APPOINTMENTS  Time spent with patient to include physician time: 38 min Signed: Theodore Demark, PA-C 02/15/2014, 5:49 PM Co-Sign MD  I have examined the patient and reviewed assessment and plan and discussed with patient.  Agree with above as stated.  Negative stress test.  Known moderate CAD, but no abnormality on functional study.  Shalon Salado S.

## 2014-02-15 NOTE — Progress Notes (Signed)
Utilization review completed.  

## 2014-02-15 NOTE — Progress Notes (Signed)
Call placed to nurse on unit to let her know we gave nitro SL X 2 and placed pt on 2lpm 02 via [place] for chest pressure.  Pt reports chest pressure subsiding.

## 2014-02-25 NOTE — Telephone Encounter (Signed)
[facility]MT[facility] on voicemail

## 2014-02-25 NOTE — Telephone Encounter (Signed)
[facility] about making appt

## 2014-04-10 ENCOUNTER — Ambulatory Visit (INDEPENDENT_AMBULATORY_CARE_PROVIDER_SITE_OTHER): Payer: BC Managed Care – PPO | Admitting: Family Medicine

## 2014-04-10 VITALS — BP 164/90 | HR 117 | Temp 98.5°F | Resp 20 | Ht 67.0 in | Wt 195.0 lb

## 2014-04-10 DIAGNOSIS — M25569 Pain in unspecified knee: Secondary | ICD-10-CM

## 2014-04-10 DIAGNOSIS — M1710 Unilateral primary osteoarthritis, unspecified knee: Secondary | ICD-10-CM

## 2014-04-10 DIAGNOSIS — M25461 Effusion, right knee: Secondary | ICD-10-CM

## 2014-04-10 DIAGNOSIS — M25561 Pain in right knee: Secondary | ICD-10-CM

## 2014-04-10 DIAGNOSIS — M25469 Effusion, unspecified knee: Secondary | ICD-10-CM

## 2014-04-10 DIAGNOSIS — M069 Rheumatoid arthritis, unspecified: Secondary | ICD-10-CM

## 2014-04-10 DIAGNOSIS — M1711 Unilateral primary osteoarthritis, right knee: Secondary | ICD-10-CM

## 2014-04-10 MED ORDER — PREDNISONE 20 MG PO TABS: 60.0000 mg | ORAL_TABLET | Freq: Every day | ORAL | Status: DC

## 2014-04-10 MED ORDER — HYDROCODONE-ACETAMINOPHEN 5-325 MG PO TABS: 1.0000 | ORAL_TABLET | Freq: Four times a day (QID) | ORAL | Status: DC | PRN

## 2014-04-10 NOTE — Progress Notes (Addendum)
This chart was scribed for Dr. [name], MD by Jarvis Morgan, Medical Scribe. This patient was seen in Room 10 and the patient's care was started at 2:18 PM.  Patient ID: [name] MRN: [id number], DOB: Jun 29, 1951, 63 y.o. Date of Encounter: 04/10/2014, 2:12 PM  Primary Physician: Pcp Not In System  Chief Complaint:  Chief Complaint  Patient presents with  . Knee Pain    -NKI-  swelling and severe pain  . Medication Refill    oxycodone; colchicine; nitrostat     HPI: 63 y.o. year old male with history below presents for refill of his oxycodone, colchicine, and nitrostat. Doing well without issues or complaints. Taking medication daily without adverse effects. Has been on medication for his right knee pain, CAD, and gout. Patient is having swelling and severe pain of his right knee. He is seeing Dr. Thomasena Edis at [facility]. Pt wears a knee brace. He had x-rays and an MRI done it showed that he has an outer and iner cartilage torn and has rheumatoid arthritis in his joints. He is going to be following up with a Rheumatologist in 3 weeks. He denies any gait problem, chest pain, shortness of breath, or numbness.  Pt is retired  Past Medical History  Diagnosis Date  . CAD (coronary artery disease)     LHC 1/08: mLAD 30-50%, mCFX 30%, left PDA 30%,  EF 55%;    Myoview  07/31/11: EF 54%, no scar or ischemia, + chest pain, + hypotensive response  . Hypertension   . Hyperlipidemia   . Chest pain   . Leukocytosis   . Gout      Home Meds: Prior to Admission medications   Medication Sig Start Date End Date Taking? Authorizing Provider  aspirin EC 81 MG EC tablet Take 1 tablet (81 mg total) by mouth daily. 02/15/14  Yes Rhonda G Barrett, PA-C  losartan (COZAAR) 100 MG tablet TAKE ONE TABLET BY MOUTH DAILY   Yes Tonny Bollman, MD  atorvastatin (LIPITOR) 40 MG tablet Take 1 tablet (40 mg total) by mouth daily at 6 PM. 02/15/14   Joline Salt Barrett, PA-C  colchicine 0.6 MG  tablet Take 2 tablets (1.2 mg total) by mouth daily. 02/15/14   Rhonda G Barrett, PA-C  HYDROcodone-acetaminophen (NORCO) 5-325 MG per tablet Take 1 tablet by mouth every 6 (six) hours as needed for moderate pain. 01/28/14   Peyton Najjar, MD  nitroGLYCERIN (NITROSTAT) 0.4 MG SL tablet Place 0.4 mg under the tongue every 5 (five) minutes as needed for chest pain.    Historical Provider, MD    Allergies: No Known Allergies  History   Social History  . Marital Status: Single    Spouse Name: N/A    Number of Children: N/A  . Years of Education: N/A   Occupational History  . Not on file.   Social History Main Topics  . Smoking status: Current Every Day Smoker -- 1.00 packs/day for 45 years    Types: Cigarettes  . Smokeless tobacco: Never Used  . Alcohol Use: No  . Drug Use: No  . Sexual Activity: Not on file   Other Topics Concern  . Not on file   Social History Narrative  . No narrative on file     Review of Systems: Constitutional: negative for chills, fever, night sweats, weight changes, or fatigue  HEENT: negative for vision changes, hearing loss, congestion, rhinorrhea, ST, epistaxis, or sinus pressure Cardiovascular: negative for chest pain or  palpitations Respiratory: negative for hemoptysis, wheezing, shortness of breath, or cough Abdominal: negative for abdominal pain, nausea, vomiting, diarrhea, or constipation Dermatological: negative for rash Neurologic: negative for headache, dizziness, or syncope Musculoskeletal: Positive for right knee pain and right knee swelling.  All other systems reviewed and are otherwise negative with the exception to those above and in the HPI.   Physical Exam: Blood pressure 164/90, pulse 117, temperature 98.5 F (36.9 C), temperature source Oral, resp. rate 20, height 5\' 7"  (1.702 m), weight 195 lb (88.451 kg), SpO2 94.00%., Body mass index is 30.53 kg/(m^2). General: Well developed, well nourished, in no acute distress. Head:  Normocephalic, atraumatic, eyes without discharge, sclera non-icteric, nares are without discharge. Bilateral auditory canals clear, TM's are without perforation, pearly grey and translucent with reflective cone of light bilaterally. Oral cavity moist, posterior pharynx without exudate, erythema, peritonsillar abscess, or post nasal drip.  Neck: Supple. No thyromegaly. Full ROM. No lymphadenopathy. Lungs: Clear bilaterally to auscultation without wheezes, rales, or rhonchi. Breathing is unlabored. Heart: RRR with S1 S2. No murmurs, rubs, or gallops appreciated. Abdomen: Soft, non-tender, non-distended with normoactive bowel sounds. No hepatosplenomegalymegaly. No rebound/guarding. No obvious abdominal masses. Msk:  Strength and tone normal for age. Extremities/Skin: Warm and dry. No clubbing or cyanosis. No edema. No rashes or suspicious lesions.  Moderate synovial thickening of wrists.  Large right knee effusion which was aspirated 20 cc of cloudy fluid. Neuro: Alert and oriented X 3. Moves all extremities spontaneously. Gait is normal. CNII-XII grossly in tact. Psych:  Responds to questions appropriately with a normal affect.   20 cloudy fluid aspirated from right knee and sent for diagnostic studies  ASSESSMENT AND PLAN:  Knee effusion, right - Plan: predniSONE (DELTASONE) 20 MG tablet, Synovial fluid, crystal, HYDROcodone-acetaminophen (NORCO) 5-325 MG per tablet  Rheumatoid arthritis involving multiple joints - Plan: predniSONE (DELTASONE) 20 MG tablet, Synovial fluid, crystal, HYDROcodone-acetaminophen (NORCO) 5-325 MG per tablet  Primary osteoarthritis of right knee - Plan: HYDROcodone-acetaminophen (NORCO) 5-325 MG per tablet  Knee pain, acute, right - Plan: HYDROcodone-acetaminophen (NORCO) 5-325 MG per tablet  Signed, , MD    Signed, [name], MD 04/10/2014 2:12 PM

## 2014-04-10 NOTE — Patient Instructions (Signed)
See the cardiologist tomorrow (Dr. Excell Seltzer) Follow up with Dr. Thomasena Edis and Dr. Lennette Bihari

## 2014-04-11 LAB — SYNOVIAL CELL COUNT + DIFF, W/ CRYSTALS
Crystals, Fluid: NONE SEEN
Eosinophils-Synovial: 0 % (ref 0–1)
Lymphocytes-Synovial Fld: 5 % (ref 0–20)
Monocyte/Macrophage: 8 % — ABNORMAL LOW (ref 50–90)
Neutrophil, Synovial: 87 % — ABNORMAL HIGH (ref 0–25)
WBC, Synovial: 28635 cu mm — ABNORMAL HIGH (ref 0–200)

## 2014-04-14 LAB — BODY FLUID CULTURE
Gram Stain: NONE SEEN
Organism ID, Bacteria: NO GROWTH

## 2014-04-18 ENCOUNTER — Encounter: Payer: Self-pay | Admitting: Cardiovascular Disease

## 2014-04-18 ENCOUNTER — Ambulatory Visit (INDEPENDENT_AMBULATORY_CARE_PROVIDER_SITE_OTHER): Payer: BC Managed Care – PPO | Admitting: Cardiovascular Disease

## 2014-04-18 VITALS — BP 148/80 | HR 107 | Ht 67.0 in | Wt 194.8 lb

## 2014-04-18 DIAGNOSIS — E7800 Pure hypercholesterolemia, unspecified: Secondary | ICD-10-CM

## 2014-04-18 DIAGNOSIS — I2510 Atherosclerotic heart disease of native coronary artery without angina pectoris: Secondary | ICD-10-CM

## 2014-04-18 MED ORDER — ROSUVASTATIN CALCIUM 5 MG PO TABS: 5.0000 mg | ORAL_TABLET | Freq: Every day | ORAL | Status: DC

## 2014-04-18 MED ORDER — METOPROLOL SUCCINATE ER 25 MG PO TB24: 25.0000 mg | ORAL_TABLET | Freq: Every day | ORAL | Status: DC

## 2014-04-18 NOTE — Progress Notes (Signed)
HPI:  63 year old gentleman presenting for followup evaluation. The patient has been followed for hypertension, hyperlipidemia, and coronary artery disease. He has known moderate disease in the LAD which has been managed medically. His most recent heart catheterization in 2013 showed a long 60% mid LAD stenosis, and mild disease in the dominant left circumflex. The patient was hospitalized in July with chest pain. He ruled out for myocardial infarction and underwent a nuclear scan which showed no scar or ischemia. His LVEF has been normal.  The patient has been doing okay. He's had no recurrence of chest pain. He has chronic shortness of breath with exertion. He continues to smoke cigarettes. He denies orthopnea, PND, or leg swelling.  The patient is having some problems with his right knee. He is going to require knee surgery. He would like to address his cardiac risk for surgery.  Outpatient Encounter Prescriptions as of 04/18/2014  Medication Sig  . aspirin EC 81 MG EC tablet Take 1 tablet (81 mg total) by mouth daily.  Marland Kitchen HYDROcodone-acetaminophen (NORCO) 5-325 MG per tablet Take 1 tablet by mouth every 6 (six) hours as needed for moderate pain.  Marland Kitchen losartan (COZAAR) 100 MG tablet TAKE ONE TABLET BY MOUTH DAILY  . nitroGLYCERIN (NITROSTAT) 0.4 MG SL tablet Place 0.4 mg under the tongue every 5 (five) minutes as needed for chest pain.  . predniSONE (DELTASONE) 20 MG tablet Take 20 mg by mouth daily with breakfast. Take three tabs daily with breakfast.  . [DISCONTINUED] atorvastatin (LIPITOR) 40 MG tablet Take 1 tablet (40 mg total) by mouth daily at 6 PM.  . [DISCONTINUED] colchicine 0.6 MG tablet Take 2 tablets (1.2 mg total) by mouth daily.  . [DISCONTINUED] predniSONE (DELTASONE) 20 MG tablet Take 3 tablets (60 mg total) by mouth daily with breakfast.    No Known Allergies  Past Medical History  Diagnosis Date  . CAD (coronary artery disease)     LHC 1/08: mLAD 30-50%, mCFX 30%, left  PDA 30%,  EF 55%;    Myoview  07/31/11: EF 54%, no scar or ischemia, + chest pain, + hypotensive response  . Hypertension   . Hyperlipidemia   . Chest pain   . Leukocytosis   . Gout     ROS: Negative except as per HPI  BP 148/80  Pulse 107  Ht 5\' 7"  (1.702 m)  Wt 194 lb 12.8 oz (88.361 kg)  BMI 30.50 kg/m2  SpO2 89%  PHYSICAL EXAM: Pt is alert and oriented, NAD HEENT: normal Neck: JVP - normal, carotids 2+= without bruits Lungs: Coarse breath sounds bilaterally CV: RRR without murmur or gallop Abd: soft, NT, Positive BS, no hepatomegaly Ext: no C/C/E, distal pulses intact and equal Skin: warm/dry no rash  Myoview Scan 02/15/2014: FINDINGS:  The patient was stressed with Lexiscan infusion. He had 10 over 10  chest pain during the infusion. This resolved afterwards. There was  no diagnostic EKG change. The raw data reveals no excess motion.  Tomographic images with stress reveal normal uptake in all segments.  Tomographic images at rest revealed normal uptake in all segments.  There is no evidence of ischemia. Wall motion analysis reveals  normal motion. The ejection fraction is 56%. The quantitative  analysis raises the question of slight decreased activity new the  apex. I believe that this is not a significant finding.  IMPRESSION:  Overall this study reveals no significant abnormality. There is no  scar or ischemia. There is normal wall motion.  Lipids 02/15/2014: Lipid Panel     Component Value Date/Time   CHOL 218* 02/15/2014 0326   TRIG 118 02/15/2014 0326   HDL 43 02/15/2014 0326   CHOLHDL 5.1 02/15/2014 0326   VLDL 24 02/15/2014 0326   LDLCALC 151* 02/15/2014 0326   ASSESSMENT AND PLAN: 1. Coronary artery disease, native vessel. Recent nuclear scan reviewed. He had no infarct or ischemia. He is not experiencing chest pain. He is at low risk of proceeding with the surgery. He does not require further ischemic evaluation at this time. I did ask him to start metoprolol  succinate back at a dose of 25 mg daily. He tends to have sinus tachycardia at rest.  2. Hypertension, suboptimal control. Continue losartan. Add metoprolol succinate 25 mg daily.  3. Tobacco abuse. He continues to smoke. Cessation counseling was done.  4. Hyperlipidemia. Lipid panel reviewed as above. His LDL is 151. I asked him to start back Crestor 5 mg daily. He has been intolerant to atorvastatin and other statin drugs.  Tonny Bollman 04/18/2014 4:39 PM

## 2014-04-18 NOTE — Patient Instructions (Signed)
Your physician has recommended you make the following change in your medication:  1. START Metoprolol Succinate 25mg  take one by mouth daily 2. START Crestor 5mg  take one by mouth daily  Your physician recommends that you return for a FASTING LIPID and LIVER in 3 MONTHS--nothing to eat or drink after midnight, lab opens at 7:30 AM  Your physician wants you to follow-up in: 1 YEAR with Dr .  You will receive a reminder letter in the mail two months in advance. If you don't receive a letter, please call our office to schedule the follow-up appointment.

## 2014-05-09 ENCOUNTER — Other Ambulatory Visit (HOSPITAL_COMMUNITY): Payer: Self-pay | Admitting: Physician Assistant

## 2014-05-17 ENCOUNTER — Ambulatory Visit (INDEPENDENT_AMBULATORY_CARE_PROVIDER_SITE_OTHER): Payer: BC Managed Care – PPO

## 2014-05-17 DIAGNOSIS — Z23 Encounter for immunization: Secondary | ICD-10-CM

## 2014-05-17 NOTE — Addendum Note (Signed)
Addended by: Ihor Dow on: 05/17/2014 04:45 PM   Modules accepted: Level of Service

## 2014-06-13 ENCOUNTER — Encounter: Payer: Self-pay | Admitting: *Deleted

## 2014-06-13 DIAGNOSIS — M0609 Rheumatoid arthritis without rheumatoid factor, multiple sites: Secondary | ICD-10-CM | POA: Insufficient documentation

## 2014-06-20 ENCOUNTER — Other Ambulatory Visit: Payer: Self-pay | Admitting: Cardiovascular Disease

## 2014-08-05 ENCOUNTER — Other Ambulatory Visit (INDEPENDENT_AMBULATORY_CARE_PROVIDER_SITE_OTHER): Payer: BLUE CROSS/BLUE SHIELD | Admitting: *Deleted

## 2014-08-05 DIAGNOSIS — E7800 Pure hypercholesterolemia, unspecified: Secondary | ICD-10-CM

## 2014-08-05 DIAGNOSIS — I2510 Atherosclerotic heart disease of native coronary artery without angina pectoris: Secondary | ICD-10-CM

## 2014-08-05 LAB — LIPID PANEL
Cholesterol: 162 mg/dL (ref 0–200)
HDL: 54.3 mg/dL (ref >39.00)
LDL CALC: 94 mg/dL (ref 0–99)
NonHDL: 107.7
TRIGLYCERIDES: 69 mg/dL (ref 0.0–149.0)
Total CHOL/HDL Ratio: 3
VLDL: 13.8 mg/dL (ref 0.0–40.0)

## 2014-08-05 LAB — HEPATIC FUNCTION PANEL
ALK PHOS: 77 U/L (ref 39–117)
ALT: 25 U/L (ref 0–53)
AST: 13 U/L (ref 0–37)
Albumin: 3.5 g/dL (ref 3.5–5.2)
BILIRUBIN TOTAL: 0.4 mg/dL (ref 0.2–1.2)
Bilirubin, Direct: 0.2 mg/dL (ref 0.0–0.3)
TOTAL PROTEIN: 7.4 g/dL (ref 6.0–8.3)

## 2014-10-01 ENCOUNTER — Ambulatory Visit (INDEPENDENT_AMBULATORY_CARE_PROVIDER_SITE_OTHER): Payer: Managed Care, Other (non HMO) | Admitting: Family Medicine

## 2014-10-01 ENCOUNTER — Ambulatory Visit (INDEPENDENT_AMBULATORY_CARE_PROVIDER_SITE_OTHER): Payer: Managed Care, Other (non HMO)

## 2014-10-01 VITALS — BP 148/80 | HR 99 | Temp 97.9°F | Resp 18 | Ht 67.0 in | Wt 200.4 lb

## 2014-10-01 DIAGNOSIS — J988 Other specified respiratory disorders: Secondary | ICD-10-CM | POA: Diagnosis not present

## 2014-10-01 DIAGNOSIS — R0989 Other specified symptoms and signs involving the circulatory and respiratory systems: Secondary | ICD-10-CM

## 2014-10-01 DIAGNOSIS — R0981 Nasal congestion: Secondary | ICD-10-CM

## 2014-10-01 DIAGNOSIS — R062 Wheezing: Secondary | ICD-10-CM

## 2014-10-01 DIAGNOSIS — R05 Cough: Secondary | ICD-10-CM | POA: Diagnosis not present

## 2014-10-01 DIAGNOSIS — J22 Unspecified acute lower respiratory infection: Secondary | ICD-10-CM

## 2014-10-01 DIAGNOSIS — M069 Rheumatoid arthritis, unspecified: Secondary | ICD-10-CM

## 2014-10-01 DIAGNOSIS — R059 Cough, unspecified: Secondary | ICD-10-CM

## 2014-10-01 LAB — COMPREHENSIVE METABOLIC PANEL WITH GFR
ALT: 20 U/L (ref 0–53)
Alkaline Phosphatase: 78 U/L (ref 39–117)
BUN: 8 mg/dL (ref 6–23)
Calcium: 8.9 mg/dL (ref 8.4–10.5)
Chloride: 102 meq/L (ref 96–112)
Creat: 0.65 mg/dL (ref 0.50–1.35)
Total Bilirubin: 0.4 mg/dL (ref 0.2–1.2)
Total Protein: 6 g/dL (ref 6.0–8.3)

## 2014-10-01 LAB — COMPREHENSIVE METABOLIC PANEL
AST: 12 U/L (ref 0–37)
Albumin: 3.4 g/dL — ABNORMAL LOW (ref 3.5–5.2)
CO2: 28 mEq/L (ref 19–32)
Glucose, Bld: 107 mg/dL — ABNORMAL HIGH (ref 70–99)
Potassium: 4.3 mEq/L (ref 3.5–5.3)
Sodium: 140 mEq/L (ref 135–145)

## 2014-10-01 LAB — POCT CBC
Granulocyte percent: 72.6 % (ref 37–80)
HCT, POC: 40.4 % — AB (ref 43.5–53.7)
Hemoglobin: 12.6 g/dL — AB (ref 14.1–18.1)
Lymph, poc: 2.1 (ref 0.6–3.4)
MCH, POC: 29 pg (ref 27–31.2)
MCHC: 31.2 g/dL — AB (ref 31.8–35.4)
MCV: 93.1 fL (ref 80–97)
MID (cbc): 1 — AB (ref 0–0.9)
MPV: 6.5 fL (ref 0–99.8)
POC Granulocyte: 8.1 — AB (ref 2–6.9)
POC LYMPH PERCENT: 18.9 %L (ref 10–50)
POC MID %: 8.5 % (ref 0–12)
Platelet Count, POC: 430 10*3/uL — AB (ref 142–424)
RBC: 4.33 M/uL — AB (ref 4.69–6.13)
RDW, POC: 17.7 %
WBC: 11.2 10*3/uL — AB (ref 4.6–10.2)

## 2014-10-01 MED ORDER — AZITHROMYCIN 250 MG PO TABS: ORAL_TABLET | ORAL | Status: DC

## 2014-10-01 MED ORDER — BENZONATATE 200 MG PO CAPS: 200.0000 mg | ORAL_CAPSULE | Freq: Three times a day (TID) | ORAL | Status: DC | PRN

## 2014-10-01 MED ORDER — HYDROCODONE-HOMATROPINE 5-1.5 MG/5ML PO SYRP: 5.0000 mL | ORAL_SOLUTION | Freq: Every evening | ORAL | Status: DC | PRN

## 2014-10-01 NOTE — Progress Notes (Signed)
Chief Complaint:  Chief Complaint  Patient presents with  . Nasal Congestion    x2 weeks  . Nausea    x2 weeks  . Headache    x2 week  . Cough    yellow phlegm    HPI: [name] is a 64 y.o. male who is here for a 2 week history of upper respiratory symptoms including yellow productive cough, shortness of breath, sinus headache, nasal congestion, nausea and some chills. He denies chest pain, ear pain, palpitations, shortness of breath. He occasionally has some wheezing. Much better in the past few days. Tried to treat himself with over-the-counter medications. He works outside a lot. He works 4 days. He is semiretired. Recently was diagnosed with rheumatoid arthritis by Dr. Judith Blonder, he is on prednisone 5 mg by mouth daily and also on methotrexate. He has not been back to see her menses in June. States that he has had worsening shortness of breath with the prednisone. [facility] has also seen Dr. Thomasena Edis for a right knee cartilage arthroscopy. He had some knee effusion and an aspiration of his right knee was done several weeks ago. Time Dr. Thomasena Edis told him to increase his prednisone and now he's currently on 5 mg daily. He is taking over-the-counter medications for his upper respiratory symptoms. He is to smoke one half pack per day.  Past Medical History  Diagnosis Date  . CAD (coronary artery disease)     LHC 1/08: mLAD 30-50%, mCFX 30%, left PDA 30%,  EF 55%;    Myoview  07/31/11: EF 54%, no scar or ischemia, + chest pain, + hypotensive response  . Hypertension   . Hyperlipidemia   . Chest pain   . Leukocytosis   . Gout    Past Surgical History  Procedure Laterality Date  . Appendectomy    . Hemorrhoid surgery    . Angioplasty     History   Social History  . Marital Status: Single    Spouse Name: N/A  . Number of Children: N/A  . Years of Education: N/A   Social History Main Topics  . Smoking status: Current Every Day Smoker -- 1.00 packs/day for 45 years   Types: Cigarettes  . Smokeless tobacco: Never Used  . Alcohol Use: No  . Drug Use: No  . Sexual Activity: Not on file   Other Topics Concern  . None   Social History Narrative   Family History  Problem Relation Age of Onset  . Coronary artery disease      Father with MI in his 26s  . Heart attack Father   . Cancer Brother     lung  . Heart attack Paternal Grandfather   . Stroke Brother   . Coronary artery disease Brother   . Heart attack Brother    No Known Allergies Prior to Admission medications   Medication Sig Start Date End Date Taking? Authorizing Provider  aspirin EC 81 MG EC tablet Take 1 tablet (81 mg total) by mouth daily. 02/15/14  Yes Rhonda G Barrett, PA-C  folic acid (FOLVITE) 1 MG tablet Take 1 mg by mouth daily.   Yes Historical Provider, MD  HYDROcodone-acetaminophen (NORCO) 5-325 MG per tablet Take 1 tablet by mouth every 6 (six) hours as needed for moderate pain. 04/10/14  Yes [name], MD  losartan (COZAAR) 100 MG tablet TAKE 1 TABLET BY MOUTH DAILY 05/10/14  Yes Tonny Bollman, MD  losartan (COZAAR) 100 MG tablet TAKE  1 TABLET BY MOUTH DAILY 06/20/14  Yes Tonny Bollman, MD  metoprolol succinate (TOPROL XL) 25 MG 24 hr tablet Take 1 tablet (25 mg total) by mouth daily. 04/18/14  Yes Tonny Bollman, MD  nitroGLYCERIN (NITROSTAT) 0.4 MG SL tablet Place 0.4 mg under the tongue every 5 (five) minutes as needed for chest pain.   Yes Historical Provider, MD  predniSONE (DELTASONE) 20 MG tablet Take 20 mg by mouth daily with breakfast. Take three tabs daily with breakfast.   Yes Historical Provider, MD  rosuvastatin (CRESTOR) 5 MG tablet Take 1 tablet (5 mg total) by mouth daily. 04/18/14  Yes Tonny Bollman, MD     ROS: The patient denies fevers, night sweats, unintentional weight loss, chest pain, palpitations,  dyspnea on exertion, nausea, vomiting, abdominal pain, dysuria, hematuria, melena, numbness, weakness, or tingling.   All other systems have been  reviewed and were otherwise negative with the exception of those mentioned in the HPI and as above.    PHYSICAL EXAM: Filed Vitals:   10/01/14 1044  BP: 148/80  Pulse: 99  Temp: 97.9 F (36.6 C)  Resp: 18   Filed Vitals:   10/01/14 1044  Height: 5\' 7"  (1.702 m)  Weight: 200 lb 6.4 oz (90.901 kg)   Body mass index is 31.38 kg/(m^2).  General: Alert, no acute distress HEENT:  Normocephalic, atraumatic, oropharynx patent. EOMI, PERRLA Cardiovascular:  Regular rate and rhythm, no rubs murmurs or gallops.  No Carotid bruits, radial pulse intact. No pedal edema.  Respiratory: Positive minimal wheezes, no appreciable rales, or rhonchi.  No cyanosis, no use of accessory musculature GI: No organomegaly, abdomen is soft and non-tender, positive bowel sounds.  No masses. Skin: No rashes. Neurologic: Facial musculature symmetric. Psychiatric: Patient is appropriate throughout our interaction. Lymphatic: No cervical lymphadenopathy Musculoskeletal: Gait intact.   LABS: Results for orders placed or performed in visit on 10/01/14  POCT CBC  Result Value Ref Range   WBC 11.2 (A) 4.6 - 10.2 K/uL   Lymph, poc 2.1 0.6 - 3.4   POC LYMPH PERCENT 18.9 10 - 50 %L   MID (cbc) 1.0 (A) 0 - 0.9   POC MID % 8.5 0 - 12 %M   POC Granulocyte 8.1 (A) 2 - 6.9   Granulocyte percent 72.6 37 - 80 %G   RBC 4.33 (A) 4.69 - 6.13 M/uL   Hemoglobin 12.6 (A) 14.1 - 18.1 g/dL   HCT, POC 12/01/14 (A) 83.4 - 53.7 %   MCV 93.1 80 - 97 fL   MCH, POC 29.0 27 - 31.2 pg   MCHC 31.2 (A) 31.8 - 35.4 g/dL   RDW, POC 19.6 %   Platelet Count, POC 430 (A) 142 - 424 K/uL   MPV 6.5 0 - 99.8 fL     EKG/XRAY:   Primary read interpreted by Dr. 22.2 at [facility]. Questionable left lobe patchy infiltrate versus bronchitis.   ASSESSMENT/PLAN: Encounter Diagnoses  Name Primary?  . Wheezing   . Cough   . Lower respiratory infection (e.g., bronchitis, pneumonia, pneumonitis, pulmonitis) Yes  . Sinus congestion   . Rheumatoid  arthritis    This is a pleasant 64 year old gentleman with a past medical history of rheumatoid arthritis who was recently diagnosed onset prednisone, hypertension, tobacco abuse who presents with a two-week history of lower respiratory infection. Questionable acute bronchitis versus pneumonia. Treat for both with a Z-Pak. He declined albuterol inhaler. Prescribed Hycodan when necessary daily at bedtime, Tessalon Perles He will continue with prednisone  5 mg daily for his rheumatoid arthritis. He was supposed to have an appointment with Dr. Lendon Colonel in March that was rescheduled. I need to find out if she wants him to continue with his prednisone since he is already on prednisone daily and also methotrexate. Additionally he would like to have an earlier appointment with Dr. Nickola Major to see what his management is of his arthritis. Follow-up as needed  Gross sideeffects, risk and benefits, and alternatives of medications d/w patient. Patient is aware that all medications have potential sideeffects and we are unable to predict every sideeffect or drug-drug interaction that may occur.  Hamilton Capri PHUONG, DO 10/01/2014 12:26 PM

## 2014-10-01 NOTE — Patient Instructions (Signed)
Acute Bronchitis °Bronchitis is inflammation of the airways that extend from the windpipe into the lungs (bronchi). The inflammation often causes mucus to develop. This leads to a cough, which is the most common symptom of bronchitis.  °In acute bronchitis, the condition usually develops suddenly and goes away over time, usually in a couple weeks. Smoking, allergies, and asthma can make bronchitis worse. Repeated episodes of bronchitis may cause further lung problems.  °CAUSES °Acute bronchitis is most often caused by the same virus that causes a cold. The virus can spread from person to person (contagious) through coughing, sneezing, and touching contaminated objects. °SIGNS AND SYMPTOMS  °· Cough.   °· Fever.   °· Coughing up mucus.   °· Body aches.   °· Chest congestion.   °· Chills.   °· Shortness of breath.   °· Sore throat.   °DIAGNOSIS  °Acute bronchitis is usually diagnosed through a physical exam. Your health care provider will also ask you questions about your medical history. Tests, such as chest X-rays, are sometimes done to rule out other conditions.  °TREATMENT  °Acute bronchitis usually goes away in a couple weeks. Oftentimes, no medical treatment is necessary. Medicines are sometimes given for relief of fever or cough. Antibiotic medicines are usually not needed but may be prescribed in certain situations. In some cases, an inhaler may be recommended to help reduce shortness of breath and control the cough. A cool mist vaporizer may also be used to help thin bronchial secretions and make it easier to clear the chest.  °HOME CARE INSTRUCTIONS °· Get plenty of rest.   °· Drink enough fluids to keep your urine clear or pale yellow (unless you have a medical condition that requires fluid restriction). Increasing fluids may help thin your respiratory secretions (sputum) and reduce chest congestion, and it will prevent dehydration.   °· Take medicines only as directed by your health care provider. °· If  you were prescribed an antibiotic medicine, finish it all even if you start to feel better. °· Avoid smoking and secondhand smoke. Exposure to cigarette smoke or irritating chemicals will make bronchitis worse. If you are a smoker, consider using nicotine gum or skin patches to help control withdrawal symptoms. Quitting smoking will help your lungs heal faster.   °· Reduce the chances of another bout of acute bronchitis by washing your hands frequently, avoiding people with cold symptoms, and trying not to touch your hands to your mouth, nose, or eyes.   °· Keep all follow-up visits as directed by your health care provider.   °SEEK MEDICAL CARE IF: °Your symptoms do not improve after 1 week of treatment.  °SEEK IMMEDIATE MEDICAL CARE IF: °· You develop an increased fever or chills.   °· You have chest pain.   °· You have severe shortness of breath. °· You have bloody sputum.   °· You develop dehydration. °· You faint or repeatedly feel like you are going to pass out. °· You develop repeated vomiting. °· You develop a severe headache. °MAKE SURE YOU:  °· Understand these instructions. °· Will watch your condition. °· Will get help right away if you are not doing well or get worse. °Document Released: 08/15/2004 Document Revised: 11/22/2013 Document Reviewed: 12/29/2012 °ExitCare® Patient Information ©2015 [employer]. This information is not intended to replace advice given to you by your health care provider. Make sure you discuss any questions you have with your health care provider. ° °

## 2014-10-02 ENCOUNTER — Encounter: Payer: Self-pay | Admitting: Family Medicine

## 2014-10-03 ENCOUNTER — Telehealth: Payer: Self-pay

## 2014-10-03 DIAGNOSIS — J4520 Mild intermittent asthma, uncomplicated: Secondary | ICD-10-CM

## 2014-10-03 MED ORDER — ALBUTEROL SULFATE HFA 108 (90 BASE) MCG/ACT IN AERS: 2.0000 | INHALATION_SPRAY | RESPIRATORY_TRACT | Status: DC | PRN

## 2014-10-03 NOTE — Telephone Encounter (Signed)
Pt called looking for inhaler Rx that was supposed to be sent to pharm. According to Dr Irwin Brakeman note on xray report, she offered to send Rx for one if pt wants one and he would like to have one to use. I have copied Dr Irwin Brakeman note below and pended Rx, but wasn't sure if you wanted to Rx for use Q4 or Q6hrs prn? Please someone else review in Dr Irwin Brakeman absence and send to pharm?  He has bronchitis with a possible touch of asthma ( it's due to his smoking) , continue with antibiotics and steroids, will call him an inhaler as needed for wheezing if he wants it. He declined last time.

## 2014-10-03 NOTE — Telephone Encounter (Signed)
Rx sent to pharmacy   

## 2014-10-04 NOTE — Telephone Encounter (Signed)
Left message letting pt know his Rx was sent to the pharmacy.

## 2014-10-06 ENCOUNTER — Other Ambulatory Visit: Payer: Self-pay | Admitting: Family Medicine

## 2014-12-11 ENCOUNTER — Other Ambulatory Visit: Payer: Self-pay | Admitting: Cardiovascular Disease

## 2014-12-14 ENCOUNTER — Other Ambulatory Visit: Payer: Self-pay | Admitting: *Deleted

## 2014-12-14 MED ORDER — LOSARTAN POTASSIUM 100 MG PO TABS: 100.0000 mg | ORAL_TABLET | Freq: Every day | ORAL | Status: DC

## 2015-01-12 IMAGING — CR DG KNEE COMPLETE 4+V*R*
3 series · 3 of 3 positions shown · non-contrast
Comparison: None.

CLINICAL DATA: Right knee osteoarthritis.

EXAM:
RIGHT KNEE - COMPLETE 4+ VIEW

[AP]
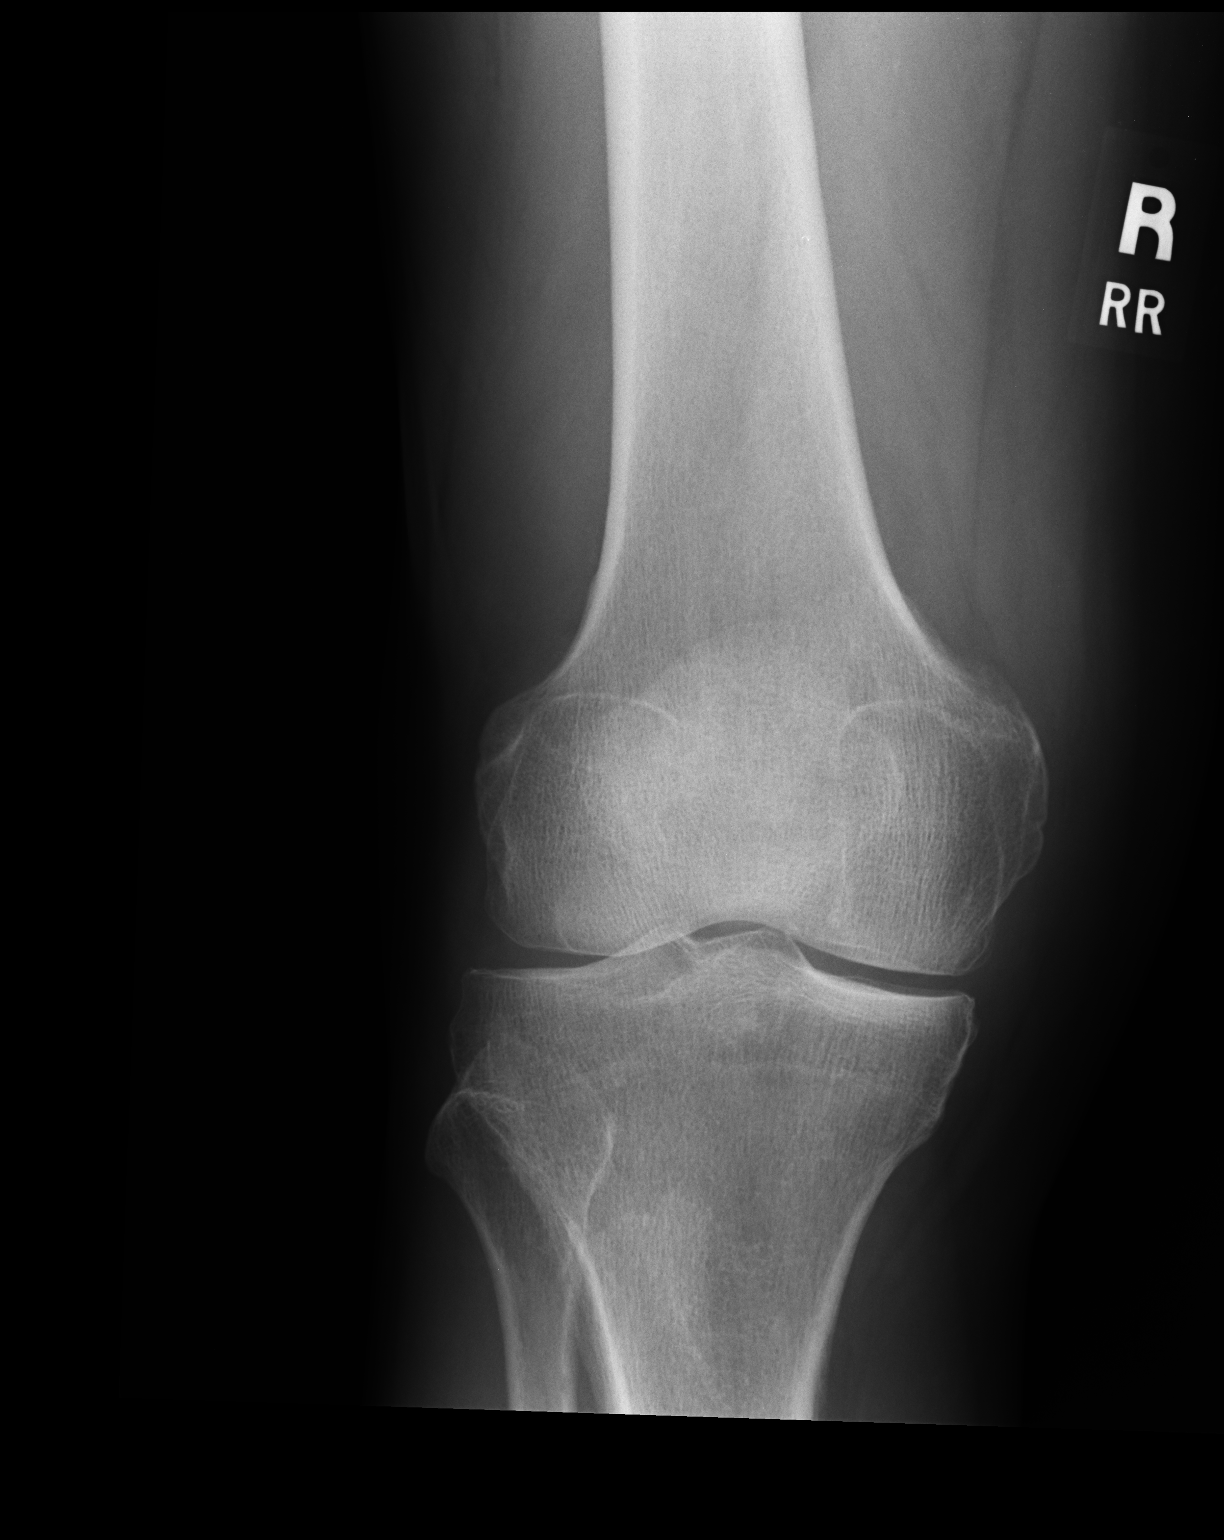

[sunrise]
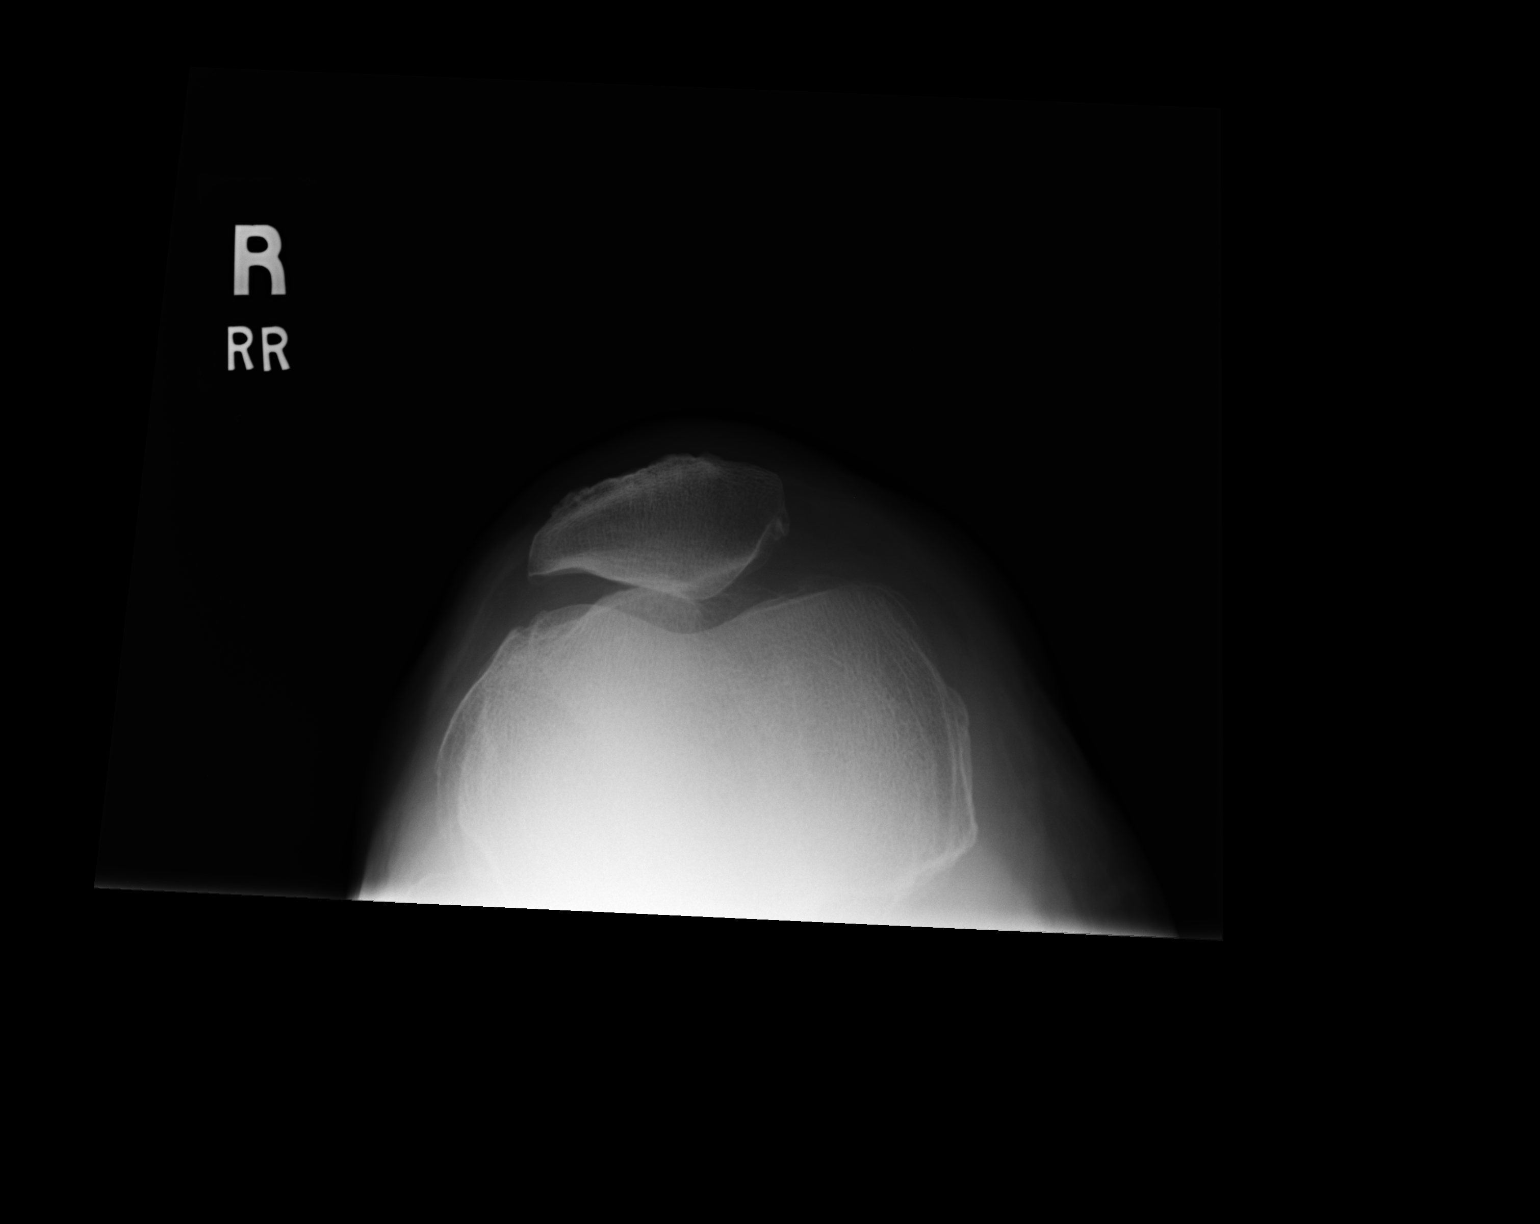

[lateral]
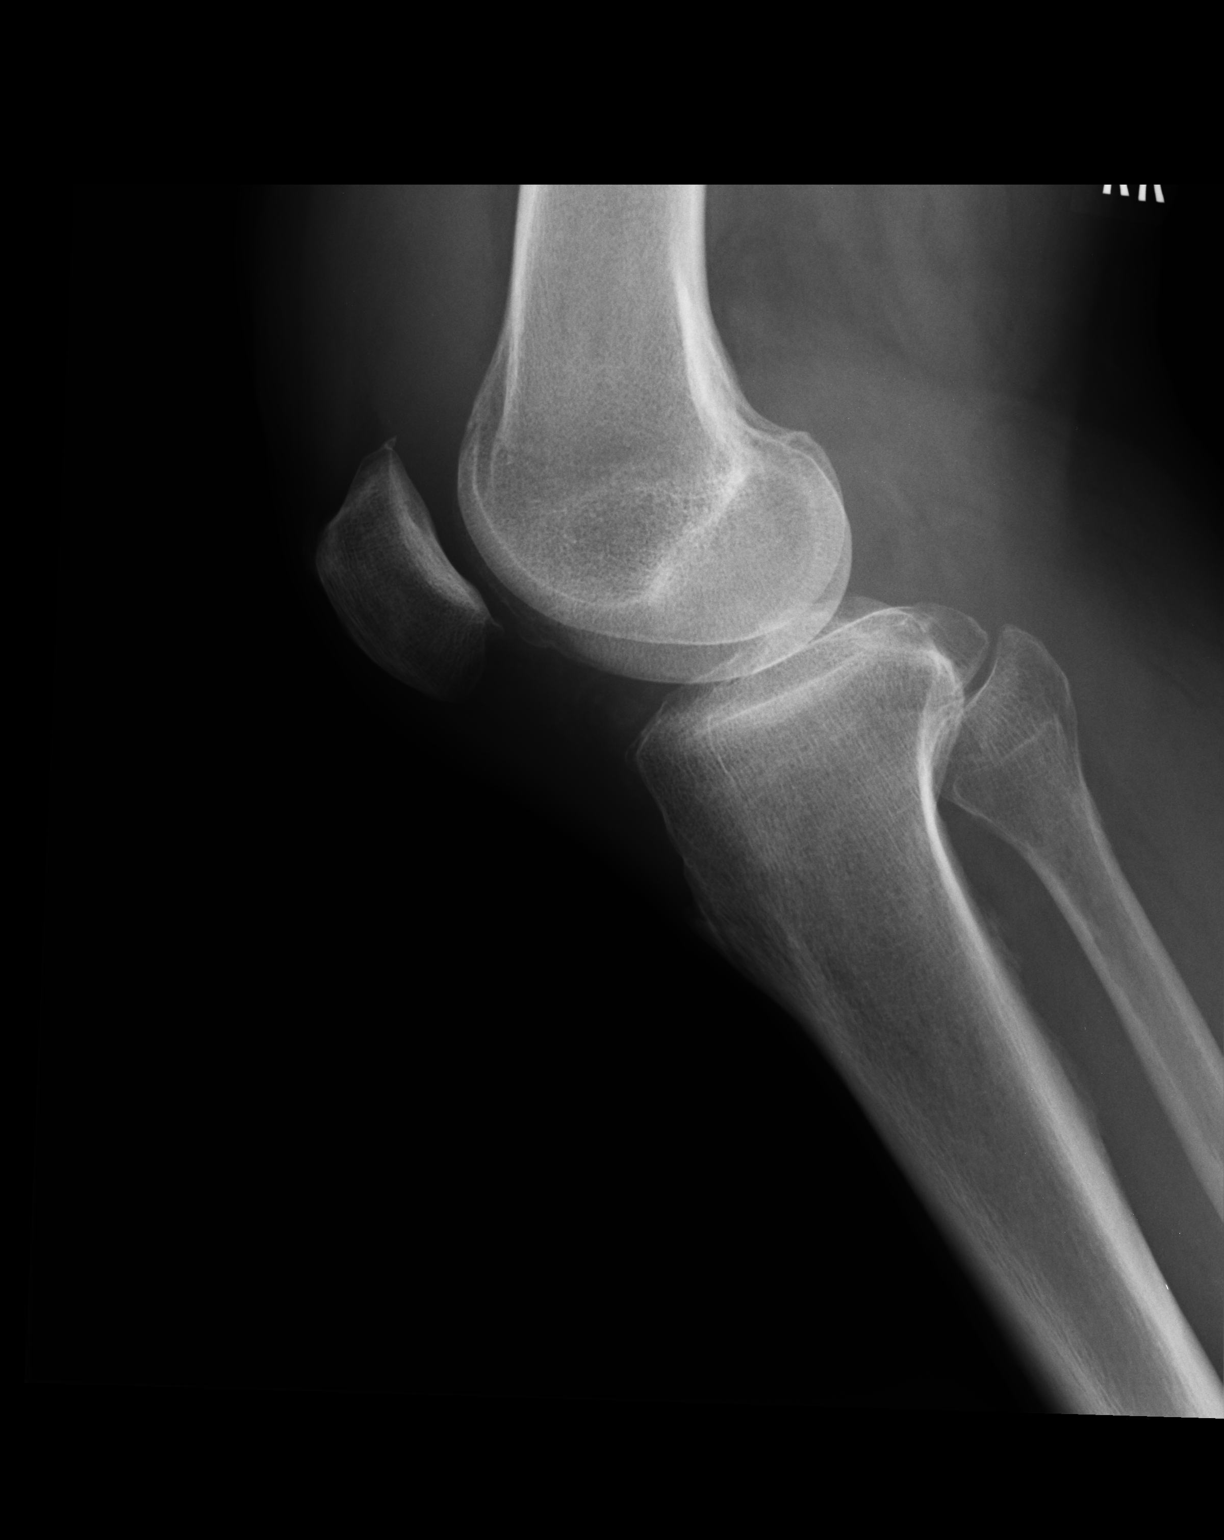

[3 of 3 positions shown; findings below may reference images not displayed]

FINDINGS: No fracture or dislocation is noted. Moderate suprapatellar joint
effusion is noted. Moderate narrowing of the medial lateral joint
spaces is noted. No soft tissue abnormality is noted.
IMPRESSION: Moderate suprapatellar joint effusion. Moderate degenerative joint
disease is noted. No fracture or dislocation is seen.

## 2015-01-12 IMAGING — CR DG CHEST 2V
2 series · 2 of 2 positions shown · non-contrast
Comparison: May 12, 2013.

CLINICAL DATA: Cough.

EXAM:
CHEST  2 VIEW

[PA]
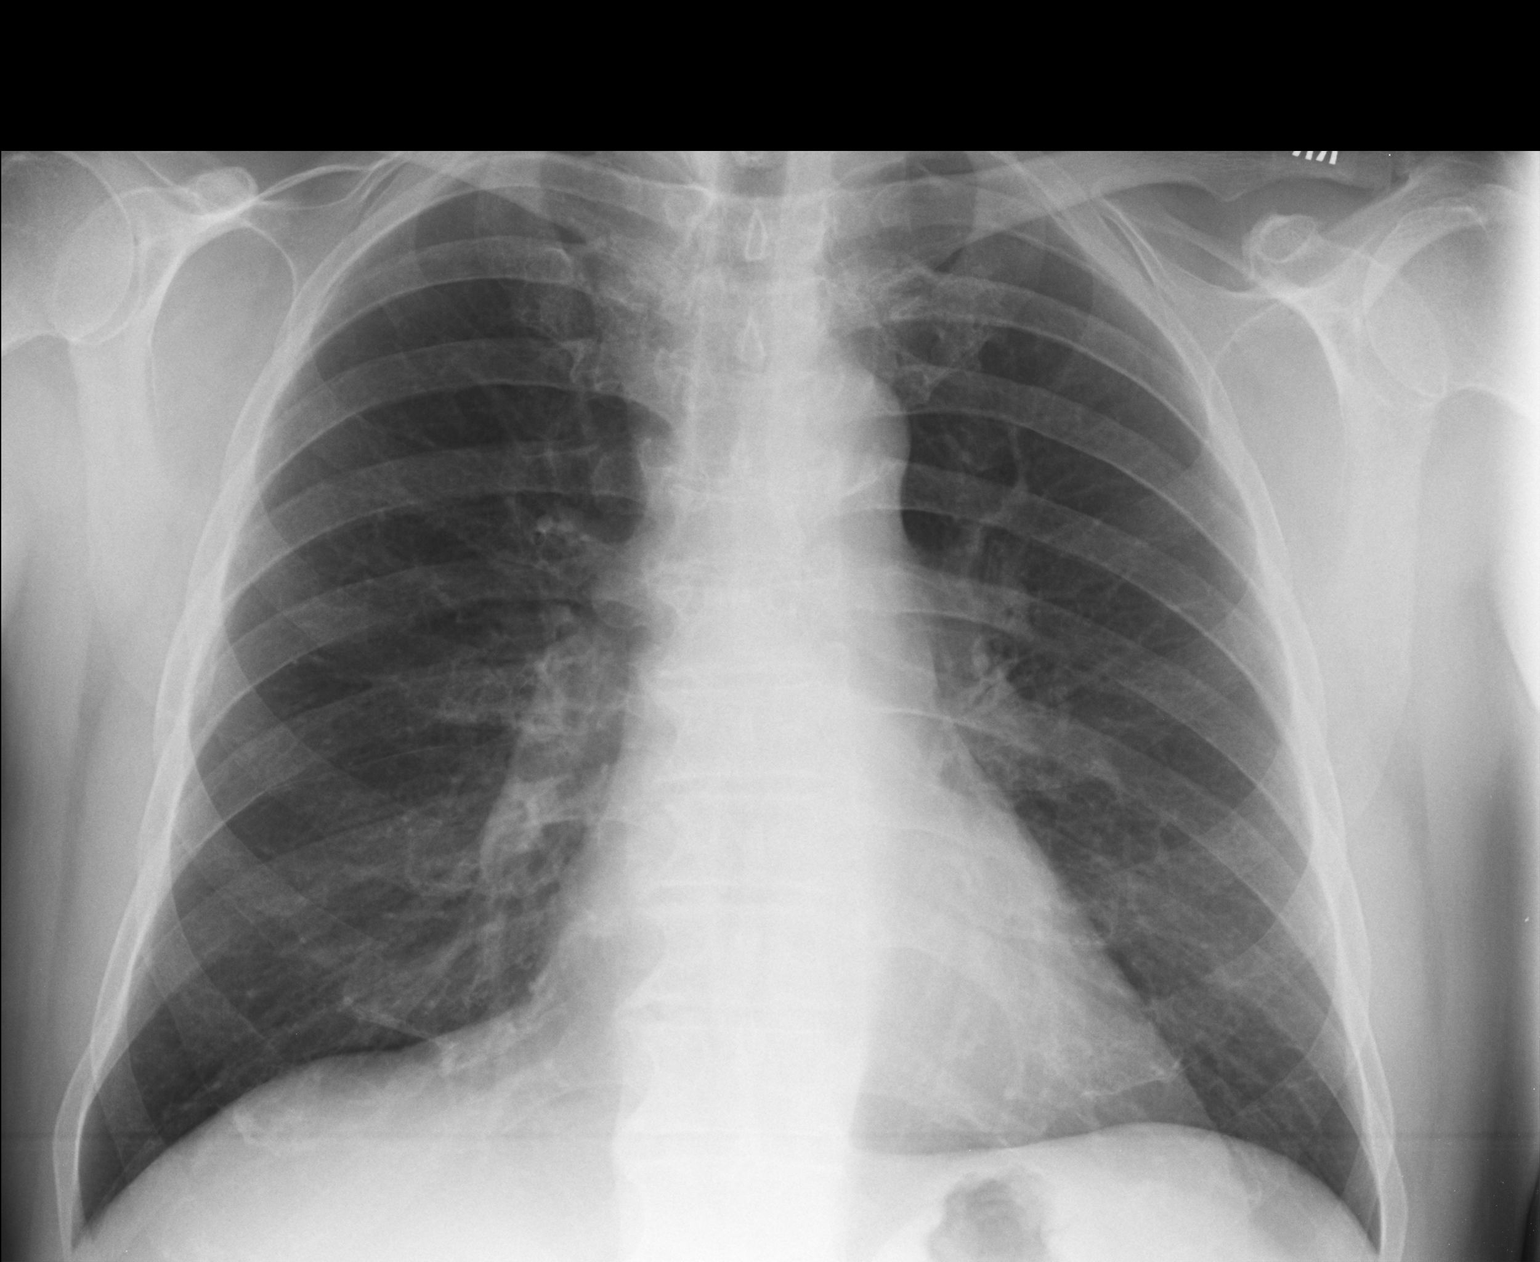

[lateral]
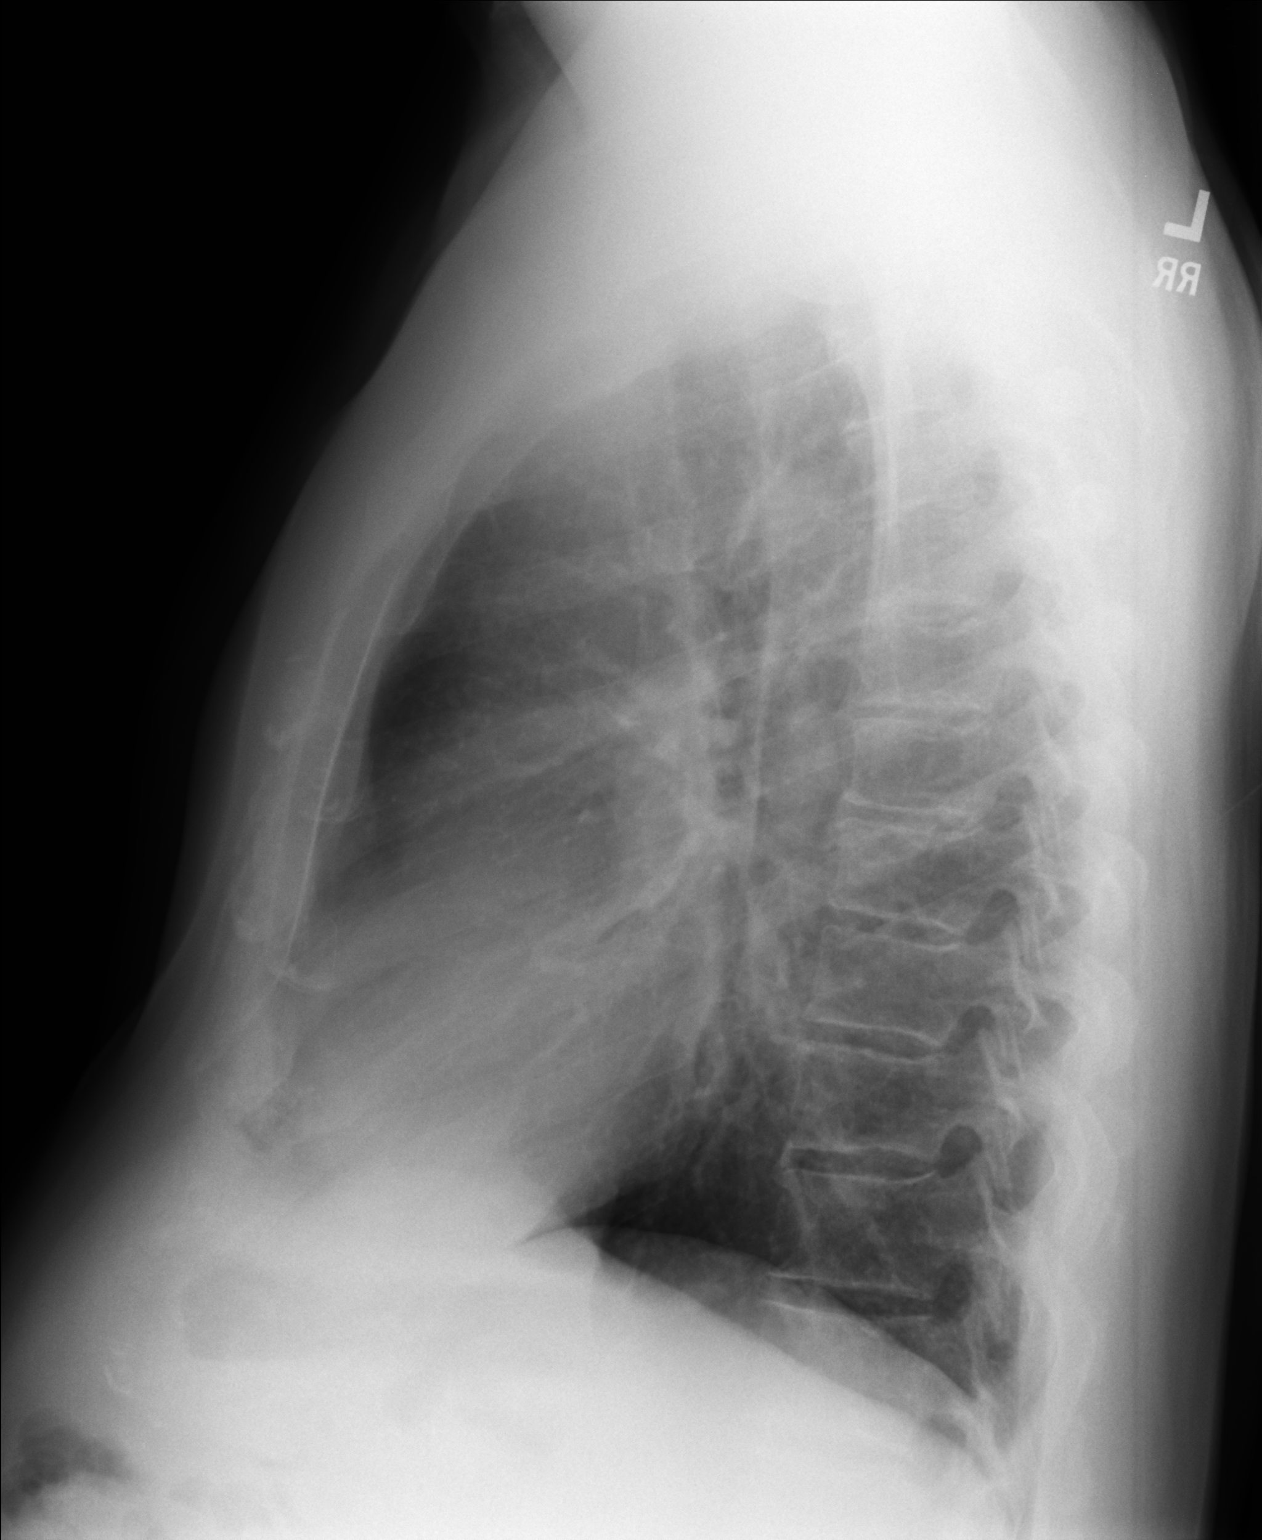

[2 of 2 positions shown; findings below may reference images not displayed]

FINDINGS: The heart size and mediastinal contours are within normal limits.
Both lungs are clear. The visualized skeletal structures are
unremarkable.
IMPRESSION: No acute cardiopulmonary abnormality seen.

## 2015-02-14 ENCOUNTER — Other Ambulatory Visit: Payer: Self-pay | Admitting: Cardiovascular Disease

## 2015-02-15 ENCOUNTER — Telehealth: Payer: Self-pay | Admitting: Cardiovascular Disease

## 2015-02-15 ENCOUNTER — Other Ambulatory Visit: Payer: Self-pay

## 2015-02-15 DIAGNOSIS — E7800 Pure hypercholesterolemia, unspecified: Secondary | ICD-10-CM

## 2015-02-15 DIAGNOSIS — I2510 Atherosclerotic heart disease of native coronary artery without angina pectoris: Secondary | ICD-10-CM

## 2015-02-15 MED ORDER — ROSUVASTATIN CALCIUM 5 MG PO TABS: 5.0000 mg | ORAL_TABLET | Freq: Every day | ORAL | Status: AC

## 2015-02-15 NOTE — Telephone Encounter (Signed)
Walk in pt form-pt is asking for refills- placed in refill box.

## 2015-03-10 ENCOUNTER — Other Ambulatory Visit: Payer: Self-pay | Admitting: Cardiovascular Disease

## 2015-04-10 ENCOUNTER — Other Ambulatory Visit: Payer: Self-pay | Admitting: Cardiovascular Disease

## 2015-04-11 ENCOUNTER — Other Ambulatory Visit: Payer: Self-pay | Admitting: Cardiovascular Disease

## 2015-04-16 ENCOUNTER — Other Ambulatory Visit: Payer: Self-pay | Admitting: Nurse Practitioner

## 2015-04-16 ENCOUNTER — Emergency Department (HOSPITAL_COMMUNITY)
Admission: EM | Admit: 2015-04-16 | Discharge: 2015-04-16 | Disposition: A | Discharge status: Home / Self Care | Payer: Managed Care, Other (non HMO) | Attending: Emergency Medicine | Admitting: Emergency Medicine

## 2015-04-16 ENCOUNTER — Telehealth: Payer: Self-pay | Admitting: Radiology

## 2015-04-16 ENCOUNTER — Encounter (HOSPITAL_COMMUNITY): Payer: Self-pay | Admitting: *Deleted

## 2015-04-16 ENCOUNTER — Ambulatory Visit (INDEPENDENT_AMBULATORY_CARE_PROVIDER_SITE_OTHER): Payer: Managed Care, Other (non HMO)

## 2015-04-16 ENCOUNTER — Ambulatory Visit (INDEPENDENT_AMBULATORY_CARE_PROVIDER_SITE_OTHER): Payer: Managed Care, Other (non HMO) | Admitting: Family Medicine

## 2015-04-16 VITALS — BP 150/80 | HR 63 | Temp 98.1°F | Resp 16 | Ht 67.0 in | Wt 203.4 lb

## 2015-04-16 DIAGNOSIS — I10 Essential (primary) hypertension: Secondary | ICD-10-CM | POA: Diagnosis not present

## 2015-04-16 DIAGNOSIS — R072 Precordial pain: Secondary | ICD-10-CM | POA: Diagnosis not present

## 2015-04-16 DIAGNOSIS — I2510 Atherosclerotic heart disease of native coronary artery without angina pectoris: Secondary | ICD-10-CM | POA: Diagnosis not present

## 2015-04-16 DIAGNOSIS — R9431 Abnormal electrocardiogram [ECG] [EKG]: Secondary | ICD-10-CM | POA: Diagnosis not present

## 2015-04-16 DIAGNOSIS — R0602 Shortness of breath: Secondary | ICD-10-CM

## 2015-04-16 DIAGNOSIS — M069 Rheumatoid arthritis, unspecified: Secondary | ICD-10-CM | POA: Diagnosis not present

## 2015-04-16 DIAGNOSIS — Z7982 Long term (current) use of aspirin: Secondary | ICD-10-CM | POA: Insufficient documentation

## 2015-04-16 DIAGNOSIS — R0981 Nasal congestion: Secondary | ICD-10-CM

## 2015-04-16 DIAGNOSIS — J069 Acute upper respiratory infection, unspecified: Secondary | ICD-10-CM

## 2015-04-16 DIAGNOSIS — F17200 Nicotine dependence, unspecified, uncomplicated: Secondary | ICD-10-CM | POA: Diagnosis present

## 2015-04-16 DIAGNOSIS — R062 Wheezing: Secondary | ICD-10-CM

## 2015-04-16 DIAGNOSIS — Z9861 Coronary angioplasty status: Secondary | ICD-10-CM | POA: Insufficient documentation

## 2015-04-16 DIAGNOSIS — R0789 Other chest pain: Secondary | ICD-10-CM

## 2015-04-16 DIAGNOSIS — R05 Cough: Secondary | ICD-10-CM

## 2015-04-16 DIAGNOSIS — Z862 Personal history of diseases of the blood and blood-forming organs and certain disorders involving the immune mechanism: Secondary | ICD-10-CM | POA: Diagnosis not present

## 2015-04-16 DIAGNOSIS — Z720 Tobacco use: Secondary | ICD-10-CM | POA: Insufficient documentation

## 2015-04-16 DIAGNOSIS — R059 Cough, unspecified: Secondary | ICD-10-CM

## 2015-04-16 DIAGNOSIS — E785 Hyperlipidemia, unspecified: Secondary | ICD-10-CM | POA: Diagnosis present

## 2015-04-16 DIAGNOSIS — IMO0001 Reserved for inherently not codable concepts without codable children: Secondary | ICD-10-CM | POA: Diagnosis present

## 2015-04-16 DIAGNOSIS — Z79899 Other long term (current) drug therapy: Secondary | ICD-10-CM | POA: Insufficient documentation

## 2015-04-16 DIAGNOSIS — Z7952 Long term (current) use of systemic steroids: Secondary | ICD-10-CM | POA: Insufficient documentation

## 2015-04-16 HISTORY — DX: Rheumatoid arthritis, unspecified: M06.9

## 2015-04-16 HISTORY — DX: Tobacco use: Z72.0

## 2015-04-16 LAB — POCT CBC
GRANULOCYTE PERCENT: 81.3 % — AB (ref 37–80)
HCT, POC: 45.1 % (ref 43.5–53.7)
HEMOGLOBIN: 14.2 g/dL (ref 14.1–18.1)
Lymph, poc: 2.5 (ref 0.6–3.4)
MCH: 28.3 pg (ref 27–31.2)
MCHC: 31.5 g/dL — AB (ref 31.8–35.4)
MCV: 89.9 fL (ref 80–97)
MID (cbc): 0.6 (ref 0–0.9)
MPV: 6.6 fL (ref 0–99.8)
PLATELET COUNT, POC: 439 10*3/uL — AB (ref 142–424)
POC Granulocyte: 13.5 — AB (ref 2–6.9)
POC LYMPH PERCENT: 14.8 %L (ref 10–50)
POC MID %: 3.9 %M (ref 0–12)
RBC: 5.02 M/uL (ref 4.69–6.13)
RDW, POC: 17.9 %
WBC: 16.6 10*3/uL — AB (ref 4.6–10.2)

## 2015-04-16 LAB — COMPREHENSIVE METABOLIC PANEL
ALK PHOS: 82 U/L (ref 38–126)
ALT: 38 U/L (ref 17–63)
ANION GAP: 9 (ref 5–15)
AST: 24 U/L (ref 15–41)
Albumin: 3.3 g/dL — ABNORMAL LOW (ref 3.5–5.0)
BUN: 15 mg/dL (ref 6–20)
CALCIUM: 9.1 mg/dL (ref 8.9–10.3)
CO2: 28 mmol/L (ref 22–32)
Chloride: 101 mmol/L (ref 101–111)
Creatinine, Ser: 0.79 mg/dL (ref 0.61–1.24)
GFR calc non Af Amer: >60 mL/min (ref >60)
Glucose, Bld: 133 mg/dL — ABNORMAL HIGH (ref 65–99)
POTASSIUM: 4.3 mmol/L (ref 3.5–5.1)
SODIUM: 138 mmol/L (ref 135–145)
Total Bilirubin: 0.5 mg/dL (ref 0.3–1.2)
Total Protein: 6.7 g/dL (ref 6.5–8.1)

## 2015-04-16 LAB — BRAIN NATRIURETIC PEPTIDE: B NATRIURETIC PEPTIDE 5: 30.1 pg/mL (ref 0.0–100.0)

## 2015-04-16 LAB — TROPONIN I

## 2015-04-16 LAB — I-STAT TROPONIN, ED
TROPONIN I, POC: 0 ng/mL (ref 0.00–0.08)
Troponin i, poc: 0.01 ng/mL (ref 0.00–0.08)

## 2015-04-16 MED ORDER — NITROGLYCERIN 0.4 MG SL SUBL: 0.4000 mg | SUBLINGUAL_TABLET | SUBLINGUAL | Status: AC | PRN

## 2015-04-16 MED ORDER — ASPIRIN 81 MG PO CHEW
324.0000 mg | CHEWABLE_TABLET | Freq: Once | ORAL | Status: AC
  Administered 2015-04-16: 324 mg via ORAL
  Filled 2015-04-16: qty 4

## 2015-04-16 MED ORDER — IPRATROPIUM BROMIDE 0.02 % IN SOLN
0.5000 mg | Freq: Once | RESPIRATORY_TRACT | Status: AC
  Administered 2015-04-16: 0.5 mg via RESPIRATORY_TRACT

## 2015-04-16 MED ORDER — ALBUTEROL SULFATE (2.5 MG/3ML) 0.083% IN NEBU
2.5000 mg | INHALATION_SOLUTION | Freq: Once | RESPIRATORY_TRACT | Status: AC
  Administered 2015-04-16: 2.5 mg via RESPIRATORY_TRACT

## 2015-04-16 MED ORDER — ALBUTEROL SULFATE HFA 108 (90 BASE) MCG/ACT IN AERS: 2.0000 | INHALATION_SPRAY | Freq: Four times a day (QID) | RESPIRATORY_TRACT | Status: AC | PRN

## 2015-04-16 MED ORDER — NITROGLYCERIN 0.4 MG SL SUBL
0.4000 mg | SUBLINGUAL_TABLET | SUBLINGUAL | Status: DC | PRN
  Administered 2015-04-16: 0.4 mg via SUBLINGUAL
  Filled 2015-04-16: qty 1

## 2015-04-16 NOTE — Patient Instructions (Signed)
Your symptoms appear to be due to a respiratory illness, but you do have some changes on her EKG compared to July of last year. With your history of heart disease, we will have you further evaluated through the emergency room.

## 2015-04-16 NOTE — ED Notes (Signed)
Pt stable, ambulatory, states understanding of discharge instructions 

## 2015-04-16 NOTE — Progress Notes (Signed)
Subjective:    Patient ID: [name], male    DOB: Feb 19, 1951, 64 y.o.   MRN: [id number]  HPI [name] is a 64 y.o. male  1 week history of initial sinus congestion, leading to cough, shortness of breath with first day or two (approx 1 week as well).  No measured fevers, but broke into sweat yesterday. No attempted treatments at home, no otc meds. Sx's about the same past few the same. No known sick contacts.   Hx of breathing problems in past requiring inhalers, but no daily meds.  Possible hx of asthma, but no testing for COPD. 45 pack yr hx of smoking - 1ppd. Still smoking.      Patient Active Problem List   Diagnosis Date Noted  . Rheumatoid arthritis of multiple sites without rheumatoid factor 06/13/2014  . Precordial pain 02/14/2014  . Smoking 05/13/2013  . Solitary pulmonary nodule 05/13/2013  . Asthma 05/12/2013  . Dyspnea 05/04/2013  . Gout attack 03/05/2013  . Leukocytosis   . Claudication 08/07/2011  . HTN (hypertension) 08/07/2011  . HLD (hyperlipidemia) 08/07/2011  . CAD (coronary artery disease) 08/07/2011  . Chest pain 07/12/2011   Past Medical History  Diagnosis Date  . CAD (coronary artery disease)     LHC 1/08: mLAD 30-50%, mCFX 30%, left PDA 30%,  EF 55%;    Myoview  07/31/11: EF 54%, no scar or ischemia, + chest pain, + hypotensive response  . Hypertension   . Hyperlipidemia   . Chest pain   . Leukocytosis   . Gout   . Arthritis     Rheumatoid   Past Surgical History  Procedure Laterality Date  . Appendectomy    . Hemorrhoid surgery    . Angioplasty    . Knee surgery     No Known Allergies Prior to Admission medications   Medication Sig Start Date End Date Taking? Authorizing Provider  acetaminophen (TYLENOL) 650 MG CR tablet Take 650 mg by mouth every 8 (eight) hours as needed for pain.   Yes Historical Provider, MD  aspirin EC 81 MG EC tablet Take 1 tablet (81 mg total) by mouth daily. 02/15/14  Yes Rhonda G Barrett, PA-C    losartan (COZAAR) 100 MG tablet TAKE 1 TABLET(100 MG) BY MOUTH DAILY 04/11/15  Yes Tonny Bollman, MD  Methotrexate, PF, (RASUVO) 17.5 MG/0.35ML SOAJ Inject into the skin.   Yes Historical Provider, MD  metoprolol succinate (TOPROL-XL) 25 MG 24 hr tablet TAKE 1 TABLET BY MOUTH DAILY 04/11/15  Yes Tonny Bollman, MD  predniSONE (DELTASONE) 20 MG tablet Take 20 mg by mouth daily with breakfast. Take three tabs daily with breakfast.   Yes Historical Provider, MD  rosuvastatin (CRESTOR) 5 MG tablet Take 1 tablet (5 mg total) by mouth daily. 02/15/15  Yes Tonny Bollman, MD   Social History   Social History  . Marital Status: Single    Spouse Name: N/A  . Number of Children: N/A  . Years of Education: N/A   Occupational History  . Not on file.   Social History Main Topics  . Smoking status: Current Every Day Smoker -- 1.00 packs/day for 45 years    Types: Cigarettes  . Smokeless tobacco: Never Used  . Alcohol Use: No  . Drug Use: No  . Sexual Activity: Not on file   Other Topics Concern  . Not on file   Social History Narrative       Review of Systems  Constitutional: Positive  for diaphoresis (yesterday only. ). Negative for fever, chills and unexpected weight change.  HENT: Positive for congestion and sinus pressure.   Respiratory: Positive for cough, shortness of breath and wheezing.   Cardiovascular: Negative for chest pain and leg swelling.  Skin: Negative for rash.       Objective:   Physical Exam  Constitutional: He is oriented to person, place, and time. He appears well-developed and well-nourished.  HENT:  Head: Normocephalic and atraumatic.  Right Ear: Tympanic membrane, external ear and ear canal normal.  Left Ear: Tympanic membrane, external ear and ear canal normal.  Nose: No rhinorrhea.  Mouth/Throat: Oropharynx is clear and moist and mucous membranes are normal. No oropharyngeal exudate or posterior oropharyngeal erythema.  Eyes: Conjunctivae are normal.  Pupils are equal, round, and reactive to light.  Neck: Neck supple.  Cardiovascular: Normal rate, regular rhythm, normal heart sounds and intact distal pulses.   No murmur heard. Pulmonary/Chest: Effort normal. No respiratory distress. He has wheezes. He has no rhonchi. He has no rales.  Decreased breath sounds, but diffuse wheeze. Normal effort no distress.  Abdominal: Soft. There is no tenderness.  Lymphadenopathy:    He has no cervical adenopathy.  Neurological: He is alert and oriented to person, place, and time.  Skin: Skin is warm and dry. No rash noted.  Psychiatric: He has a normal mood and affect. His behavior is normal.  Vitals reviewed.  Filed Vitals:   04/16/15 0816  BP: 150/80  Pulse: 63  Temp: 98.1 F (36.7 C)  TempSrc: Oral  Resp: 16  Height: 5\' 7"  (1.702 m)  Weight: 203 lb 6.4 oz (92.262 kg)  SpO2: 90%   Results for orders placed or performed in visit on 04/16/15  POCT CBC  Result Value Ref Range   WBC 16.6 (A) 4.6 - 10.2 K/uL   Lymph, poc 2.5 0.6 - 3.4   POC LYMPH PERCENT 14.8 10 - 50 %L   MID (cbc) 0.6 0 - 0.9   POC MID % 3.9 0 - 12 %M   POC Granulocyte 13.5 (A) 2 - 6.9   Granulocyte percent 81.3 (A) 37 - 80 %G   RBC 5.02 4.69 - 6.13 M/uL   Hemoglobin 14.2 14.1 - 18.1 g/dL   HCT, POC 15.4 00.8 - 53.7 %   MCV 89.9 80 - 97 fL   MCH, POC 28.3 27 - 31.2 pg   MCHC 31.5 (A) 31.8 - 35.4 g/dL   RDW, POC 67.6 %   Platelet Count, POC 439 (A) 142 - 424 K/uL   MPV 6.6 0 - 99.8 fL   [facility] reading (PRIMARY) by  Dr. Neva Seat: Chest x-ray:  Diffuse increased markings in the right lower lobe, right middle lobe with possible early infiltrate, and left lower lobe.   Baseline peak flow: Peak flow reading is 250, about 50 % of predicted. Albuterol and Atrovent nebs given   9:28 AM Peak flow reading is 300, about 60 % of predicted. O2 sat 94% Still decreased airflow throughout, but less wheeze. Few possible coarse breath sounds left lower lobe. The right lower lobe, but  again decreased breath sounds throughout. Symptomatically minimal improvement, still feels tight and some shortness of breath.  9:58 AM CXR report: FINDINGS: The heart size and mediastinal contours are within normal limits. Both lungs are clear. The visualized skeletal structures are unremarkable. IMPRESSION: No active cardiopulmonary disease. Will Check EKG, BMP and PT, troponin with his history of heart disease, and current shortness of breath  and chest tightness, but denies chest pain.  EKG reviewed.. 70 development of a right bundle branch block with QRS of 128. Q wave in V2 and T-wave inversion V2 that was not present on tracing July 2015. Also new development of wide QRS since July 2015.  10:14 AM Will place IV for transport to [facility] ER. Charge nurse to be advised. Eval for EKG changes, underlying coronary disease with shortness of breath and chest tightness for the past 1 week.      Assessment & Plan:   [name] is a 64 y.o. male Cough - Plan: POCT CBC, DG Chest 2 View, Pro b natriuretic peptide  Wheezing - Plan: ipratropium (ATROVENT) nebulizer solution 0.5 mg, albuterol (PROVENTIL) (2.5 MG/3ML) 0.083% nebulizer solution 2.5 mg, DG Chest 2 View, Pro b natriuretic peptide, Troponin I  Tobacco abuse  Coronary artery disease involving native coronary artery of native heart without angina pectoris - Plan: Pro b natriuretic peptide, EKG 12-Lead  Chest tightness - Plan: EKG 12-Lead  Shortness of breath - Plan: Pro b natriuretic peptide  1 week history of chest tightness, cough, shortness of breath and wheezing. Initial presentation appeared to be more consistent with a asthma or COPD exacerbation. Notable for elevated white blood cell count, but chest x-ray without focal pneumonia on over read. With his underlying coronary disease, EKG was obtained which does show some changes from July 2015. There appears to be new development of a right bundle branch block, with  widening of the QRS was not seen previously as well as T-wave inversion and Q wave in V2.  - Will send to ER by EMS for further evaluation. IV placed for transport. Of note a troponin and BNP was obtained here but suspect this will be repeated in the emergency room. Charge nurse advised.   Meds ordered this encounter  Medications  . Methotrexate, PF, (RASUVO) 17.5 MG/0.35ML SOAJ    Sig: Inject into the skin.  Marland Kitchen acetaminophen (TYLENOL) 650 MG CR tablet    Sig: Take 650 mg by mouth every 8 (eight) hours as needed for pain.  Marland Kitchen ipratropium (ATROVENT) nebulizer solution 0.5 mg    Sig:   . albuterol (PROVENTIL) (2.5 MG/3ML) 0.083% nebulizer solution 2.5 mg    Sig:    There are no Patient Instructions on file for this visit.

## 2015-04-16 NOTE — ED Notes (Signed)
Pt refusing 2nd Nitro.  Pain 5/10, BP 146/72

## 2015-04-16 NOTE — ED Notes (Signed)
Lab at the bedside 

## 2015-04-16 NOTE — ED Notes (Signed)
MD at bedside. 

## 2015-04-16 NOTE — Telephone Encounter (Signed)
Stat labs have resulted/ Dr Neva Seat advised

## 2015-04-16 NOTE — ED Provider Notes (Signed)
CSN: [id number]     Arrival date & time 04/16/15  1100 History   First MD Initiated Contact with Patient 04/16/15 1108     Chief Complaint  Patient presents with  . Shortness of Breath     (Consider location/radiation/quality/duration/timing/severity/associated sxs/prior Treatment) Patient is a 64 y.o. male presenting with shortness of breath.  Shortness of Breath Severity:  Moderate Onset quality:  Gradual Duration:  1 week Timing:  Constant Progression:  Waxing and waning Chronicity:  New Associated symptoms: chest pain (chest tightness over one week, not exertional, not associated with any activity, sometimes tighter than others), cough (mild), headaches and sore throat   Associated symptoms: no abdominal pain, no diaphoresis (woke up sweating one night), no fever, no rash, no sputum production (did a little bit yesterday), no syncope and no vomiting   Chest pain:    Quality:  Pressure   Severity:  Severe   Onset quality:  Gradual Risk factors: tobacco use   Risk factors: no hx of PE/DVT   Risk factors comment:  CAD catheterization 2009, myoview, Dr. Excell Seltzer cardiologist had 3 caths have appt to see him in December   Past Medical History  Diagnosis Date  . CAD (coronary artery disease)     a. LHC 1/08: mLAD 30-50%, mCFX 30%, left PDA 30%,  EF 55%; b. Myoview  07/31/11: EF 54%, no scar or ischemia, + chest pain, + hypotensive response; c. 2013 Cath: LAD 72m, LCX min irregs->Med Rx;  d. 01/2014 MV: nl EF, no isch/scar.  . Hypertension   . Hyperlipidemia   . Chest pain   . Leukocytosis   . Gout   . Rheumatoid arthritis   . Tobacco abuse    Past Surgical History  Procedure Laterality Date  . Appendectomy    . Hemorrhoid surgery    . Angioplasty    . Knee surgery     Family History  Problem Relation Age of Onset  . Coronary artery disease      Father with MI in his 6s  . Heart attack Father   . Cancer Brother     lung  . Heart attack Paternal Grandfather   . Stroke  Brother   . Coronary artery disease Brother   . Heart attack Brother    Social History  Substance Use Topics  . Smoking status: Current Every Day Smoker -- 1.00 packs/day for 45 years    Types: Cigarettes  . Smokeless tobacco: Never Used  . Alcohol Use: No    Review of Systems  Constitutional: Negative for fever and diaphoresis (woke up sweating one night).  HENT: Positive for congestion and sore throat.   Eyes: Negative for visual disturbance.  Respiratory: Positive for cough (mild) and shortness of breath. Negative for sputum production (did a little bit yesterday).   Cardiovascular: Positive for chest pain (chest tightness over one week, not exertional, not associated with any activity, sometimes tighter than others). Negative for syncope.  Gastrointestinal: Negative for nausea, vomiting, abdominal pain, diarrhea and constipation.  Genitourinary: Negative for difficulty urinating.  Musculoskeletal: Negative for back pain and neck stiffness.  Skin: Negative for rash.  Neurological: Positive for headaches. Negative for syncope.      Allergies  Review of patient's allergies indicates no known allergies.  Home Medications   Prior to Admission medications   Medication Sig Start Date End Date Taking? Authorizing Idali Lafever  acetaminophen (TYLENOL) 650 MG CR tablet Take 650 mg by mouth every 8 (eight) hours as needed for pain.  Yes Historical Nyla Creason, MD  aspirin EC 81 MG EC tablet Take 1 tablet (81 mg total) by mouth daily. 02/15/14  Yes Rhonda G Barrett, PA-C  losartan (COZAAR) 100 MG tablet TAKE 1 TABLET(100 MG) BY MOUTH DAILY 04/11/15  Yes Tonny Bollman, MD  Methotrexate, PF, (RASUVO) 15 MG/0.3ML SOAJ Inject 15 mg into the skin once a week.   Yes Historical Tahjae Durr, MD  metoprolol succinate (TOPROL-XL) 25 MG 24 hr tablet TAKE 1 TABLET BY MOUTH DAILY 04/11/15  Yes Tonny Bollman, MD  predniSONE (DELTASONE) 5 MG tablet Take 15 mg by mouth daily. 03/21/15  Yes Historical Latera Mclin,  MD  rosuvastatin (CRESTOR) 5 MG tablet Take 1 tablet (5 mg total) by mouth daily. 02/15/15  Yes Tonny Bollman, MD  albuterol (PROVENTIL HFA;VENTOLIN HFA) 108 (90 BASE) MCG/ACT inhaler Inhale 2 puffs into the lungs every 6 (six) hours as needed for wheezing or shortness of breath. 04/16/15   Ok Anis, NP  nitroGLYCERIN (NITROSTAT) 0.4 MG SL tablet Place 1 tablet (0.4 mg total) under the tongue every 5 (five) minutes as needed for chest pain. 04/16/15   Ok Anis, NP   BP 143/64 mmHg  Pulse 67  Temp(Src) 97.7 F (36.5 C) (Oral)  Resp 17  Ht 5\' 9"  (1.753 m)  Wt 202 lb (91.627 kg)  BMI 29.82 kg/m2  SpO2 93% Physical Exam  Constitutional: He is oriented to person, place, and time. He appears well-developed and well-nourished. No distress.  HENT:  Head: Normocephalic and atraumatic.  Eyes: Conjunctivae and EOM are normal.  Neck: Normal range of motion.  Cardiovascular: Normal rate, regular rhythm, normal heart sounds and intact distal pulses.  Exam reveals no gallop and no friction rub.   No murmur heard. Pulmonary/Chest: Effort normal. No respiratory distress. He has wheezes (end expiratory). He has no rales.  Abdominal: Soft. He exhibits no distension. There is no tenderness. There is no guarding.  Musculoskeletal: He exhibits no edema.  Neurological: He is alert and oriented to person, place, and time.  Skin: Skin is warm and dry. He is not diaphoretic.  Nursing note and vitals reviewed.   ED Course  Procedures (including critical care time) Labs Review Labs Reviewed  COMPREHENSIVE METABOLIC PANEL - Abnormal; Notable for the following:    Glucose, Bld 133 (*)    Albumin 3.3 (*)    All other components within normal limits  BRAIN NATRIURETIC PEPTIDE  I-STAT TROPOININ, ED  Rosezena Sensor, ED    Imaging Review Dg Chest 2 View  04/16/2015   CLINICAL DATA:  Chest tightness, shortness of breath for 1 week. Cough, wheezing.  EXAM: CHEST  2 VIEW  COMPARISON:   10/01/2014  FINDINGS: The heart size and mediastinal contours are within normal limits. Both lungs are clear. The visualized skeletal structures are unremarkable.  IMPRESSION: No active cardiopulmonary disease.   Electronically Signed   By: Charlett Nose M.D.   On: 04/16/2015 09:37   I have personally reviewed and evaluated these images and lab results as part of my medical decision-making.   EKG Interpretation None      MDM   Final diagnoses:  Chest tightness  Shortness of breath  Nasal congestion  Viral URI   64 year old male with a history of coronary artery disease with last cath in 2009 showing 60% lesion, hypertension, hyperlipidemia presents as transfer from urgent care for concern of chest tightness, shortness of breath, sinus congestion and mild cough for 1 week. Chest x-ray was completed at urgent care  and showed no findings of pneumonia, no pulmonary edema, no pneumothorax. EKG is shows a right bundle branch from a previous incomplete right bundle branch block without other acute changes.  Patient with negative i-STAT troponin, normal BNP.  CBC from urgent care shows mild leukocytosis, likely secondary to viral process/sinus congestion. Patient's chest tightness improved with nitroglycerin.    Differential diagnosis continues to include coronary artery disease with angina or COPD exacerbation.  Cardiology Dr. Ladona Ridgel was consulted and came to the bedside to evaluate the patient.  Delta troponins negative and doubt ACS.  Cardiology offered admission for observation however patient declined.  He will follow up with Dr. Excell Seltzer as an outpatient.  Cardiology wrote rx for albuterol and nitroglycerine.  Recommend PCP and cardiology follow up for CP, URI, COPD.  Patient discharged in stable condition with understanding of reasons to return.     Alvira Monday, MD 04/16/15 2030

## 2015-04-16 NOTE — Consult Note (Signed)
Patient ID: [name] MRN: [id number], DOB/AGE: Nov 23, 1950   Admit date: 04/16/2015   Primary Physician: Pcp Not In System Primary Cardiologist: M. Excell Seltzer, MD   Pt. Profile:  64 y/o male with a h/o chest pain and non-obstructive CAD, who presented to the ED today 2/2 a 1 wk h/o chest and sinus congestion and tightness with new finding of rbbb.  Problem List  Past Medical History  Diagnosis Date  . CAD (coronary artery disease)     a. LHC 1/08: mLAD 30-50%, mCFX 30%, left PDA 30%,  EF 55%; b. Myoview  07/31/11: EF 54%, no scar or ischemia, + chest pain, + hypotensive response; c. 2013 Cath: LAD 45m, LCX min irregs->Med Rx;  d. 01/2014 MV: nl EF, no isch/scar.  . Hypertension   . Hyperlipidemia   . Chest pain   . Leukocytosis   . Gout   . Rheumatoid arthritis   . Tobacco abuse     Past Surgical History  Procedure Laterality Date  . Appendectomy    . Hemorrhoid surgery    . Angioplasty    . Knee surgery       Allergies  No Known Allergies  HPI  64 y/o male with a h/o non-obs CAD, c/p, htn, hl, and ongoing tobacco abuse.  He has had multiple cardiac evaluations in the past including caths in 2008 and 2014.  He has known moderate LAD dzs with minor irregularities in his LCX.  He was last admitted for c/p in 01/2014, @ which time he r/o and had a nl cardiolite.  He was last seen by Dr. Excell Seltzer in 03/2014.    He was in his usoh until about one week ago, when he began to note sinus congestion and drainage along with chest congestion, tightness, and wheezing.  He thought perhaps that he was coming down with a cold.  His chest tightness has been constant and does not change with activity, though he has become increasingly dyspneic on exertion over the past week.  He has continued to wheeze and also continued to smoke.  He denies fevers, chills, or significant cough.  Due to ongoing Ss, he presented to Urgent Care today and an ECG was performed and revealed a new RBBB (prev had  IVCD).  He was treated with an inhaler and then taken via ambulance to the ED for evaluation.  Here, he was treated with sl NTG.  Following that, he noted improvement in his chest tightness.  He is currently c/p free.  Trop is neg x 2.  He wishes to go home.  Home Medications  Prior to Admission medications   Medication Sig Start Date End Date Taking? Authorizing Provider  acetaminophen (TYLENOL) 650 MG CR tablet Take 650 mg by mouth every 8 (eight) hours as needed for pain.   Yes Historical Provider, MD  aspirin EC 81 MG EC tablet Take 1 tablet (81 mg total) by mouth daily. 02/15/14  Yes Rhonda G Barrett, PA-C  losartan (COZAAR) 100 MG tablet TAKE 1 TABLET(100 MG) BY MOUTH DAILY 04/11/15  Yes Tonny Bollman, MD  Methotrexate, PF, (RASUVO) 15 MG/0.3ML SOAJ Inject 15 mg into the skin once a week.   Yes Historical Provider, MD  metoprolol succinate (TOPROL-XL) 25 MG 24 hr tablet TAKE 1 TABLET BY MOUTH DAILY 04/11/15  Yes Tonny Bollman, MD  predniSONE (DELTASONE) 5 MG tablet Take 15 mg by mouth daily. 03/21/15  Yes Historical Provider, MD  rosuvastatin (CRESTOR) 5 MG tablet Take 1  tablet (5 mg total) by mouth daily. 02/15/15  Yes Tonny Bollman, MD    Family History  Family History  Problem Relation Age of Onset  . Coronary artery disease      Father with MI in his 34s  . Heart attack Father   . Cancer Brother     lung  . Heart attack Paternal Grandfather   . Stroke Brother   . Coronary artery disease Brother   . Heart attack Brother     Social History  Social History   Social History  . Marital Status: Single    Spouse Name: N/A  . Number of Children: N/A  . Years of Education: N/A   Occupational History  . Not on file.   Social History Main Topics  . Smoking status: Current Every Day Smoker -- 1.00 packs/day for 45 years    Types: Cigarettes  . Smokeless tobacco: Never Used  . Alcohol Use: No  . Drug Use: No  . Sexual Activity: Not on file   Other Topics Concern  . Not  on file   Social History Narrative     Review of Systems General:  No chills, fever, night sweats or weight changes.  Cardiovascular:  +++ constant chest tightness assoc with wheezing.  +++ dyspnea on exertion, no edema, orthopnea, palpitations, paroxysmal nocturnal dyspnea. Dermatological: No rash, lesions/masses Respiratory: No cough, +++ dyspnea and wheezing. Urologic: No hematuria, dysuria Abdominal:   No nausea, vomiting, diarrhea, bright red blood per rectum, melena, or hematemesis Neurologic:  No visual changes, wkns, changes in mental status. All other systems reviewed and are otherwise negative except as noted above.  Physical Exam  Blood pressure 117/72, pulse 74, temperature 97.7 F (36.5 C), temperature source Oral, resp. rate 18, height 5\' 9"  (1.753 m), weight 202 lb (91.627 kg), SpO2 95 %.  General: Pleasant, NAD Psych: Normal affect. Neuro: Alert and oriented X 3. Moves all extremities spontaneously. HEENT: Normal  Neck: Supple without bruits or JVD. Lungs:  Resp regular and unlabored, coarse breath sounds with diffuse insp/exp wheezing throughout. Heart: RRR no s3, s4, or murmurs. Abdomen: Soft, non-tender, non-distended, BS + x 4.  Extremities: No clubbing, cyanosis or edema. DP/PT/Radials 2+ and equal bilaterally.  Labs  Troponin [facility] Test)  Recent Labs  04/16/15 1150  TROPIPOC 0.00    Recent Labs  04/16/15 0958  TROPONINI <0.01   Lab Results  Component Value Date   WBC 16.6* 04/16/2015   HGB 14.2 04/16/2015   HCT 45.1 04/16/2015   MCV 89.9 04/16/2015   PLT 487* 02/14/2014    Recent Labs Lab 04/16/15 1137  NA 138  K 4.3  CL 101  CO2 28  BUN 15  CREATININE 0.79  CALCIUM 9.1  PROT 6.7  BILITOT 0.5  ALKPHOS 82  ALT 38  AST 24  GLUCOSE 133*   Lab Results  Component Value Date   CHOL 162 08/05/2014   HDL 54.30 08/05/2014   LDLCALC 94 08/05/2014   TRIG 69.0 08/05/2014    Radiology/Studies  Dg Chest 2  View  04/16/2015   CLINICAL DATA:  Chest tightness, shortness of breath for 1 week. Cough, wheezing.  EXAM: CHEST  2 VIEW  COMPARISON:  10/01/2014  FINDINGS: The heart size and mediastinal contours are within normal limits. Both lungs are clear. The visualized skeletal structures are unremarkable.  IMPRESSION: No active cardiopulmonary disease.   Electronically Signed   By: 12/01/2014 M.D.   On: 04/16/2015 09:37  ECG  RSR, 77, RBBB (new), LAE.  ASSESSMENT AND PLAN  1.  Chest tightness/Dyspnea:  Pt presented to the ED this afternoon following a one week h/o constant chest tightness associated with wheezing and dyspnea.  Symptoms improved following an inhaler treatment and sl ntg.  Despite constant Ss over the past week, his troponin is normal, though ECG now shows a RBBB w/o ischemic changes.  He has refused admission/observation.  In light of normal troponins, he may be discharged from the ED and we will arrange for an outpatient lexiscan myoview along with f/u with Dr. Excell Seltzer.  He should continue on asa, bb, and statin therapy, and I will send in a Rx for prn albuterol and ntg.  He will need to f/u with his PCP for further mgmt of COPD.  Smoking cessation advised.  2.  Tob Abuse:  Cessation advised.  He is not considering quitting.  3.  Essential HTN:  Stable.  4.  HL:  LDL 94 in January.  Cont statin.  Signed, Nicolasa Ducking, NP 04/16/2015, 2:57 PM   Cardiology Attending  Patient seen and examined. Agree with the above history, with minor modification, exam, assessment and plan. The patient has chest pain with typical and atypical features and no objective evidence of coronary ischemia. I have recommended her be admitted for observation but he refuses. He is pain free. He will be discharged home with a prescription for ntg and albuterol. He will followup with Dr. Excell Seltzer. He is encouraged not to smoke.  Leonia Reeves.D.

## 2015-04-16 NOTE — ED Notes (Addendum)
Pt presents via [facility] from [facility] for c/o shortness of breath x 1 week.  Pt also reports sinus pressure and HA x 1 week.  Pt was at UC, EKG showed changes, new RBBB, also elevated WBC.  UC administered duoneb with improvement, lung sounds clear with EMS.  Hx: Cad, MI, PNA, HTN.  EKG SR with PVCS, BP-190/97, P-80 O2-93% RA.  Pt reports chest pressure "feels like an 18 wheeler on my chest", also productive cough.  pt a x 4, NAD.

## 2015-04-18 LAB — PRO B NATRIURETIC PEPTIDE: Pro B Natriuretic peptide (BNP): 76.09 pg/mL (ref <126)

## 2015-04-21 ENCOUNTER — Ambulatory Visit (INDEPENDENT_AMBULATORY_CARE_PROVIDER_SITE_OTHER): Payer: Managed Care, Other (non HMO) | Admitting: Family Medicine

## 2015-04-21 VITALS — BP 172/88 | HR 97 | Temp 98.6°F | Resp 18 | Ht 68.0 in | Wt 206.0 lb

## 2015-04-21 DIAGNOSIS — J209 Acute bronchitis, unspecified: Secondary | ICD-10-CM | POA: Diagnosis not present

## 2015-04-21 MED ORDER — LEVOFLOXACIN 500 MG PO TABS: 500.0000 mg | ORAL_TABLET | Freq: Every day | ORAL | Status: AC

## 2015-04-21 NOTE — Progress Notes (Signed)
This chart was scribed for [name], MD by Stann Ore, medical scribe at [facility].The patient was seen in exam room 5 and the patient's care was started at 6:43 PM.  Patient ID: [name] MRN: [id number], DOB: Feb 13, 1951, 64 y.o. Date of Encounter: 04/21/2015  Primary Physician: Pcp Not In System  Chief Complaint:  Chief Complaint  Patient presents with  . Follow-up    pt states same problems are not getting better     HPI:  [name] is a 64 y.o. male who presents to [facility] for follow up.  He was seen 5 days ago and still has the same problems. He states that he is not getting better. He's had this issue for a week now.   He has some coughs, congestion, and some tinnitus. He still smokes.   He had left knee surgery back in Oct 2015.   Past Medical History  Diagnosis Date  . CAD (coronary artery disease)     a. LHC 1/08: mLAD 30-50%, mCFX 30%, left PDA 30%,  EF 55%; b. Myoview  07/31/11: EF 54%, no scar or ischemia, + chest pain, + hypotensive response; c. 2013 Cath: LAD 47m, LCX min irregs->Med Rx;  d. 01/2014 MV: nl EF, no isch/scar.  . Hypertension   . Hyperlipidemia   . Chest pain   . Leukocytosis   . Gout   . Rheumatoid arthritis   . Tobacco abuse      Home Meds: Prior to Admission medications   Medication Sig Start Date End Date Taking? Authorizing Provider  acetaminophen (TYLENOL) 650 MG CR tablet Take 650 mg by mouth every 8 (eight) hours as needed for pain.   Yes Historical Provider, MD  albuterol (PROVENTIL HFA;VENTOLIN HFA) 108 (90 BASE) MCG/ACT inhaler Inhale 2 puffs into the lungs every 6 (six) hours as needed for wheezing or shortness of breath. 04/16/15  Yes Ok Anis, NP  aspirin EC 81 MG EC tablet Take 1 tablet (81 mg total) by mouth daily. 02/15/14  Yes Rhonda G Barrett, PA-C  losartan (COZAAR) 100 MG tablet TAKE 1 TABLET(100 MG) BY MOUTH DAILY 04/11/15  Yes Tonny Bollman, MD    Methotrexate, PF, (RASUVO) 15 MG/0.3ML SOAJ Inject 15 mg into the skin once a week.   Yes Historical Provider, MD  metoprolol succinate (TOPROL-XL) 25 MG 24 hr tablet TAKE 1 TABLET BY MOUTH DAILY 04/11/15  Yes Tonny Bollman, MD  nitroGLYCERIN (NITROSTAT) 0.4 MG SL tablet Place 1 tablet (0.4 mg total) under the tongue every 5 (five) minutes as needed for chest pain. 04/16/15  Yes Ok Anis, NP  predniSONE (DELTASONE) 5 MG tablet Take 15 mg by mouth daily. 03/21/15  Yes Historical Provider, MD  rosuvastatin (CRESTOR) 5 MG tablet Take 1 tablet (5 mg total) by mouth daily. 02/15/15  Yes Tonny Bollman, MD    Allergies: No Known Allergies  Social History   Social History  . Marital Status: Single    Spouse Name: N/A  . Number of Children: N/A  . Years of Education: N/A   Occupational History  . Not on file.   Social History Main Topics  . Smoking status: Current Every Day Smoker -- 1.00 packs/day for 45 years    Types: Cigarettes  . Smokeless tobacco: Never Used  . Alcohol Use: No  . Drug Use: No  . Sexual Activity: Not on file   Other Topics Concern  . Not on file  Social History Narrative     Review of Systems: Constitutional: negative for chills, fever, night sweats, weight changes, or fatigue  HEENT: negative for vision changes, congestion, rhinorrhea, ST, epistaxis, or sinus pressure; positive for congestion, tinnitus, sneezing Cardiovascular: negative for chest pain or palpitations Respiratory: negative for hemoptysis, shortness of breath; positive for cough, wheezing Abdominal: negative for abdominal pain, nausea, vomiting, diarrhea, or constipation Dermatological: negative for rash Neurologic: negative for headache, dizziness, or syncope All other systems reviewed and are otherwise negative with the exception to those above and in the HPI.  Physical Exam: Blood pressure 172/88, pulse 97, temperature 98.6 F (37 C), temperature source Oral, resp. rate 18,  height 5\' 8"  (1.727 m), weight 206 lb (93.441 kg), SpO2 95 %., Body mass index is 31.33 kg/(m^2). General: Well developed, well nourished, in no acute distress. Head: Normocephalic, atraumatic, eyes without discharge, sclera non-icteric, nares are without discharge.; posterior pharynx erythematous Neck: Supple. No thyromegaly. Full ROM. No lymphadenopathy. Lungs: Clear bilaterally to auscultation without rales, or rhonchi. Breathing is unlabored.; wheezing bilaterally Heart: RRR with S1 S2. No murmurs, rubs, or gallops appreciated. Abdomen: Soft, non-tender, non-distended with normoactive bowel sounds. No hepatomegaly. No rebound/guarding. No obvious abdominal masses. Msk:  Strength and tone normal for age. Extremities/Skin: Warm and dry. No clubbing or cyanosis. No edema. No rashes or suspicious lesions. Neuro: Alert and oriented X 3. Moves all extremities spontaneously. Gait is normal. CNII-XII grossly in tact. Psych:  Responds to questions appropriately with a normal affect.    ASSESSMENT AND PLAN:  64 y.o. year old male with chronic bronchitis and superimposed acute bronchitis This chart was scribed in my presence and reviewed by me personally.    ICD-9-CM ICD-10-CM   1. Acute bronchitis, unspecified organism 466.0 J20.9 levofloxacin (LEVAQUIN) 500 MG tablet      By signing my name below, I, 77, attest that this documentation has been prepared under the direction and in the presence of Stann Ore, MD. Electronically Signed: [name], Scribe. 04/21/2015 , 6:43 PM .  Signed, 04/23/2015, MD 04/21/2015 6:43 PM

## 2015-04-21 NOTE — Patient Instructions (Signed)

## 2015-04-28 ENCOUNTER — Encounter (HOSPITAL_COMMUNITY): Payer: BLUE CROSS/BLUE SHIELD

## 2015-05-12 ENCOUNTER — Ambulatory Visit: Payer: BLUE CROSS/BLUE SHIELD | Admitting: Cardiology

## 2015-05-27 ENCOUNTER — Ambulatory Visit (INDEPENDENT_AMBULATORY_CARE_PROVIDER_SITE_OTHER): Payer: Managed Care, Other (non HMO) | Admitting: Family Medicine

## 2015-05-27 VITALS — BP 140/70 | HR 119 | Temp 98.7°F | Resp 20 | Ht 68.0 in | Wt 204.8 lb

## 2015-05-27 DIAGNOSIS — M069 Rheumatoid arthritis, unspecified: Secondary | ICD-10-CM

## 2015-05-27 DIAGNOSIS — M25461 Effusion, right knee: Secondary | ICD-10-CM

## 2015-05-27 DIAGNOSIS — M05671 Rheumatoid arthritis of right ankle and foot with involvement of other organs and systems: Secondary | ICD-10-CM | POA: Diagnosis not present

## 2015-05-27 DIAGNOSIS — M25561 Pain in right knee: Secondary | ICD-10-CM

## 2015-05-27 LAB — SYNOVIAL CELL COUNT + DIFF, W/ CRYSTALS
Crystals, Fluid: NONE SEEN
Eosinophils-Synovial: 0 % (ref 0–1)
LYMPHOCYTES-SYNOVIAL FLD: 0 % (ref 0–20)
MONOCYTE/MACROPHAGE: 5 % — AB (ref 50–90)
Neutrophil, Synovial: 95 % — ABNORMAL HIGH (ref 0–25)
WBC, SYNOVIAL: 25690 uL — AB (ref 0–200)

## 2015-05-27 MED ORDER — TRIAMCINOLONE ACETONIDE 40 MG/ML IJ SUSP: 40.0000 mg | Freq: Once | INTRAMUSCULAR | Status: AC

## 2015-05-27 MED ORDER — HYDROCODONE-ACETAMINOPHEN 7.5-325 MG PO TABS: 1.0000 | ORAL_TABLET | Freq: Four times a day (QID) | ORAL | Status: AC | PRN

## 2015-05-27 NOTE — Progress Notes (Signed)
Patient ID: [name], male    DOB: Nov 23, 1950  Age: 64 y.o. MRN: [id number]  Chief Complaint  Patient presents with  . Knee Injury     swollen, yesterday     Subjective:   64 year old man whom I last saw couple of years ago. He requested that I see him today. He has had a lot of problems with DJD of the right knee. He has had arthroscopic meniscus repair a year ago. He has had problems with pain in the knee, but over the last couple of weeks has gotten more more swollen. It is so tight it is just miserable to him. No other major complaints today. He has not had any injuries of that knee.  Current allergies, medications, problem list, past/family and social histories reviewed.  Objective:  BP 140/70 mmHg  Pulse 119  Temp(Src) 98.7 F (37.1 C) (Oral)  Resp 20  Ht 5\' 8"  (1.727 m)  Wt 204 lb 12.8 oz (92.897 kg)  BMI 31.15 kg/m2  SpO2 94%  No acute distress. Tight effusion right knee. Cannot bend the knee without a lot of pain and has very limited range of motion.  Discussed with him options for the knee, and he prefers going ahead and doing an aspiration of the joint. He has had it aspirated in the past.  Procedure note Used a lateral approach. Prep was done. Local anesthetic was used to numb the skin. An 18-gauge needle was used to aspirate the fluid, with about 45 mL of fluid obtained. There are still more fluid present, but I was unable to get it to continue coming out. Needle was repositioned and a little more pain, but it would not keep coming. 1 mL of Kenalog 40+2 mL of 2% lidocaine were injected without any resistance. Patient got immediate relief of pain and discomfort in the knee. He was able to walk much better. There were still effusion present on palpation. He was told that it is hopeful the anti-inflammatory effect of the medication will take that swelling on down.  He has been diagnosed recently with rotatory arthritis also and chose not to follow his rheumatologist  when she moved gone to another practice. He has a appointment with the new rheumatologist he was assigned to, but that is not for another week or 2.  Assessment & Plan:   Assessment: 1. Pain in right knee   2. Effusion of right knee   3. Rheu arthrit of right ank/ft w involv of organs and systems   4. Rheumatoid arthritis involving multiple sites, unspecified rheumatoid factor presence (HCC)       Plan: Joint aspiration is noted  Orders Placed This Encounter  Procedures  . Synovial cell count + diff, w/ crystals    Meds ordered this encounter  Medications  . HYDROcodone-acetaminophen (NORCO) 7.5-325 MG tablet    Sig: Take 1 tablet by mouth every 6 (six) hours as needed.    Dispense:  25 tablet    Refill:  0  . triamcinolone acetonide (KENALOG-40) injection 40 mg    Sig:          Patient Instructions  Apply ice to knee for 4-5 times daily for the next 2 days for about 15 minutes  Rest your leg as much as possible this weekend  If you end up needing a work excuse beyond Monday let me know  Follow-up with the rheumatologist  Take extra strength Tylenol 2 pills twice daily. The maximum acetaminophen you should take in  24 hours is 3000 mg  Take hydrocodone/acetaminophen 7.5/325 one pill every 4-6 hours as needed for severe pain. However since these each contain 325 mg of acetaminophen keep this in your 3000 mg total goal.  Return if worse or not improving, or see her orthopedic doctor back also.    No Follow-up on file.   HOPPER,DAVID, MD 05/27/2015

## 2015-05-27 NOTE — Patient Instructions (Signed)
Apply ice to knee for 4-5 times daily for the next 2 days for about 15 minutes  Rest your leg as much as possible this weekend  If you end up needing a work excuse beyond Monday let me know  Follow-up with the rheumatologist  Take extra strength Tylenol 2 pills twice daily. The maximum acetaminophen you should take in 24 hours is 3000 mg  Take hydrocodone/acetaminophen 7.5/325 one pill every 4-6 hours as needed for severe pain. However since these each contain 325 mg of acetaminophen keep this in your 3000 mg total goal.  Return if worse or not improving, or see her orthopedic doctor back also.

## 2015-06-21 ENCOUNTER — Telehealth: Payer: Self-pay | Admitting: Cardiovascular Disease

## 2015-06-21 NOTE — Telephone Encounter (Signed)
Voicemail left for patient again.

## 2015-06-21 NOTE — Telephone Encounter (Signed)
Pt moved out of area and needs records-will come by on Friday 06-23-15 and signed release if he can get his records then-also I noticed he has an appt with Dr. Excell Seltzer 07-07-15 would you mind asking him if he is keeping that or if I need to cxl?

## 2015-06-21 NOTE — Telephone Encounter (Signed)
Spoke with patient he is aware I can process the last 2 years of records and will have these ready for him Friday 06/23/15 for his pick up. The rest of records patient is aware our copy service will copy and mail to his new doctor He is agreeable with this.

## 2015-06-21 NOTE — Telephone Encounter (Signed)
Left patient VM to return my call.  

## 2015-07-04 ENCOUNTER — Telehealth: Payer: Self-pay | Admitting: Cardiovascular Disease

## 2015-07-04 NOTE — Telephone Encounter (Signed)
New message      FYI Pt cancelled 07-07-15 appt stating he has moved to [place], [place].  He wanted to thank Dr Excell Seltzer for taking good care of him.

## 2015-07-04 NOTE — Telephone Encounter (Signed)
Expand All Collapse All   New message      FYI Pt cancelled 07-07-15 appt stating he has moved to [place], [place]. He wanted to thank Dr Excell Seltzer for taking good care of him.

## 2015-07-07 ENCOUNTER — Ambulatory Visit: Payer: BLUE CROSS/BLUE SHIELD | Admitting: Cardiovascular Disease
